# Patient Record
Sex: Female | Born: 1985 | Race: White | Hispanic: No | Marital: Single | Smoking: Former smoker
Health system: Southern US, Community
[De-identification: ages and names within clinical notes are randomized; demographics above are authoritative.]

## PROBLEM LIST (undated history)

## (undated) DIAGNOSIS — R030 Elevated blood-pressure reading, without diagnosis of hypertension: Secondary | ICD-10-CM

## (undated) DIAGNOSIS — F419 Anxiety disorder, unspecified: Secondary | ICD-10-CM

## (undated) DIAGNOSIS — C801 Malignant (primary) neoplasm, unspecified: Secondary | ICD-10-CM

## (undated) DIAGNOSIS — E876 Hypokalemia: Secondary | ICD-10-CM

## (undated) DIAGNOSIS — Z8489 Family history of other specified conditions: Secondary | ICD-10-CM

## (undated) DIAGNOSIS — D649 Anemia, unspecified: Secondary | ICD-10-CM

## (undated) DIAGNOSIS — R519 Headache, unspecified: Secondary | ICD-10-CM

## (undated) DIAGNOSIS — F32A Depression, unspecified: Secondary | ICD-10-CM

## (undated) DIAGNOSIS — K5090 Crohn's disease, unspecified, without complications: Secondary | ICD-10-CM

## (undated) DIAGNOSIS — F329 Major depressive disorder, single episode, unspecified: Secondary | ICD-10-CM

## (undated) DIAGNOSIS — R51 Headache: Secondary | ICD-10-CM

## (undated) DIAGNOSIS — J189 Pneumonia, unspecified organism: Secondary | ICD-10-CM

## (undated) HISTORY — DX: Depression, unspecified: F32.A

## (undated) HISTORY — PX: UMBILICAL HERNIA REPAIR: SHX196

## (undated) HISTORY — DX: Major depressive disorder, single episode, unspecified: F32.9

## (undated) HISTORY — DX: Anxiety disorder, unspecified: F41.9

## (undated) HISTORY — DX: Hypokalemia: E87.6

---
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
## 2007-04-04 ENCOUNTER — Ambulatory Visit (HOSPITAL_BASED_OUTPATIENT_CLINIC_OR_DEPARTMENT_OTHER): Admission: RE | Admit: 2007-04-04 | Discharge: 2007-04-04 | Payer: Self-pay | Admitting: General Surgery

## 2007-05-28 ENCOUNTER — Emergency Department: Payer: Self-pay | Admitting: Emergency Medicine

## 2007-05-29 ENCOUNTER — Other Ambulatory Visit: Payer: Self-pay

## 2007-08-29 ENCOUNTER — Emergency Department: Payer: Self-pay | Admitting: Emergency Medicine

## 2007-10-26 ENCOUNTER — Emergency Department (HOSPITAL_COMMUNITY): Admission: EM | Admit: 2007-10-26 | Discharge: 2007-10-26 | Payer: Self-pay | Admitting: Emergency Medicine

## 2008-01-09 ENCOUNTER — Emergency Department: Payer: Self-pay | Admitting: Emergency Medicine

## 2008-07-17 ENCOUNTER — Emergency Department: Payer: Self-pay | Admitting: Emergency Medicine

## 2008-08-15 ENCOUNTER — Emergency Department: Payer: Self-pay | Admitting: Emergency Medicine

## 2008-09-12 ENCOUNTER — Emergency Department: Payer: Self-pay | Admitting: Unknown Physician Specialty

## 2009-01-14 ENCOUNTER — Emergency Department: Payer: Self-pay

## 2009-06-20 ENCOUNTER — Observation Stay: Payer: Self-pay

## 2009-08-13 ENCOUNTER — Observation Stay: Payer: Self-pay | Admitting: Obstetrics and Gynecology

## 2009-08-29 ENCOUNTER — Inpatient Hospital Stay: Payer: Self-pay

## 2009-08-29 ENCOUNTER — Observation Stay: Payer: Self-pay

## 2010-07-15 NOTE — Op Note (Signed)
[name], [name]              ACCOUNT NO.:  [id number]   MEDICAL RECORD NO.:  [id number]          PATIENT TYPE:  AMB   LOCATION:  [facility]                         FACILITY:  [facility]   PHYSICIAN:  Anselm Pancoast. Weatherly, M.D.DATE OF BIRTH:  1985/08/07   DATE OF PROCEDURE:  04/04/2007  DATE OF DISCHARGE:                               OPERATIVE REPORT   PREOPERATIVE DIAGNOSIS:  Small supraumbilical hernia recent postpartum.   POSTOPERATIVE DIAGNOSIS:  Small supraumbilical hernia recent postpartum.   OPERATION:  Repair of small supraumbilical hernia, no mesh.   ANESTHESIA:  General.   HISTORY:  Mary Maddox is a 25 year old young female who is now about  3 months postpartum during her pregnancy.  She had a little bulge just  above the navel a little left of midline that has been tender.  She has  done well following her delivery, vaginal delivery, and on physical exam  in the office it was not an obvious bulge but on exam, when you press  the area about 3/4 of an inch above the true umbilicus, there was a  little bulge that was tender and she desired that it be surgically  corrected.  She smokes about 1/2 pack of cigarettes a day.  Her husband  is a Arts development officer stationed at [employer] and the patient's family lives  here in [place] and she is going to stay a few days with her Mom and  desired to have this repaired here.  On physical exam preoperatively  there is not an obvious bulge but when you press on the area just above  the umbilicus there is a little bulge that is easily felt that she is  kind of squeamish about.  I could not feel a significant large bulge and  I discussed with her that I would make my judgment on whether mesh was  needed, since the defect was so small I think it is not.  She is in  agreement with this and preop she was given 1 gram of Ancef, positioned  on the OR table and induction of general anesthesia, we used an LOA but  did not paralyze her.  The patient  was prepped with Betadine solution,  draped in a sterile manner and on pressing on the area now you can still  feel that little tender area or a little bulge right above the fascia.  I made a little transverse incision within a skin fold, sharp dissected  down through the subcutaneous tissue and you could see the preperitoneal  fatty tissue coming out through this little fascia defect.  I removed  it, opened the defect just a little larger so I could close the  underlying peritoneum and preperitoneal fascia with a figure-of-eight  suture of #2-0 Surgilon.  With this done __________ Hemostat and  __________,  I think it would be best just to go ahead and close it  transversely with interrupted sutures of #0 Surgilon and not place a  piece of mesh.  I do not really feel a definite fascia defect at the  true umbilicus itself.  This was done, 6 sutures  were placed and the  little defect appears to be closed securely and then I put about 10 mL  of 0.5% Marcaine with adrenaline in for immediate postoperative pain.  I  then closed the subcutaneous tissue with 2 sutures down deep of #2-0  Vicryl and then #3-0 Vicryl subcuticular and then 1/2-inch Steri-Strips  and Benzoin on the skin.  A little occlusive dressing was applied kind  of rolled up in the umbilicus and a 4 x 4 over it held in place with  Hypafix.  The patient will be released after a short stay and will be in  town at least a couple days with her Mom, if her husband and she needs  to go to [facility] towards the end of the week I think it would be  fine, and I will see her in the office in about 3 weeks.  If they desire  that she be rechecked that can be arranged but I do not think that it is  necessary that I see her again until approximately 3 weeks after  surgery.  The patient will be given some Vicodin for initial pain and  some Darvocet and she is not to drive if she is taking the Vicodin for  pain.            ______________________________  Anselm Pancoast. Zachery Dakins, M.D.     WJW/MEDQ  D:  04/04/2007  T:  04/04/2007  Job:  [id number]

## 2010-11-21 LAB — POCT HEMOGLOBIN-HEMACUE: Hemoglobin: 16.4 — ABNORMAL HIGH

## 2011-01-30 ENCOUNTER — Emergency Department: Payer: Self-pay | Admitting: *Deleted

## 2011-02-11 ENCOUNTER — Emergency Department: Payer: Self-pay | Admitting: *Deleted

## 2011-02-18 ENCOUNTER — Other Ambulatory Visit: Payer: Self-pay | Admitting: Internal Medicine

## 2011-02-18 ENCOUNTER — Ambulatory Visit
Admission: RE | Admit: 2011-02-18 | Discharge: 2011-02-18 | Disposition: A | Discharge status: Home / Self Care | Payer: BC Managed Care – PPO | Source: Ambulatory Visit | Attending: Internal Medicine | Admitting: Internal Medicine

## 2011-02-20 ENCOUNTER — Other Ambulatory Visit: Payer: Self-pay | Admitting: Internal Medicine

## 2011-02-20 DIAGNOSIS — R197 Diarrhea, unspecified: Secondary | ICD-10-CM

## 2011-02-26 ENCOUNTER — Ambulatory Visit
Admission: RE | Admit: 2011-02-26 | Discharge: 2011-02-26 | Disposition: A | Discharge status: Home / Self Care | Payer: BC Managed Care – PPO | Source: Ambulatory Visit | Attending: Internal Medicine | Admitting: Internal Medicine

## 2011-02-26 DIAGNOSIS — R1110 Vomiting, unspecified: Secondary | ICD-10-CM

## 2011-02-26 MED ORDER — IOHEXOL 300 MG/ML  SOLN
20.0000 mL | Freq: Once | INTRAMUSCULAR | Status: AC | PRN
  Administered 2011-02-26: 20 mL via ORAL

## 2011-02-26 MED ORDER — IOHEXOL 300 MG/ML  SOLN
100.0000 mL | Freq: Once | INTRAMUSCULAR | Status: AC | PRN
  Administered 2011-02-26: 100 mL via INTRAVENOUS

## 2011-03-02 ENCOUNTER — Encounter: Payer: Self-pay | Admitting: Gastroenterology

## 2011-03-03 DIAGNOSIS — C801 Malignant (primary) neoplasm, unspecified: Secondary | ICD-10-CM

## 2011-03-03 HISTORY — DX: Malignant (primary) neoplasm, unspecified: C80.1

## 2011-03-03 HISTORY — PX: LEEP: SHX91

## 2011-03-18 ENCOUNTER — Ambulatory Visit: Payer: BC Managed Care – PPO | Admitting: Gastroenterology

## 2011-07-26 ENCOUNTER — Emergency Department: Payer: Self-pay | Admitting: *Deleted

## 2011-07-26 LAB — COMPREHENSIVE METABOLIC PANEL WITH GFR
Albumin: 4 g/dL
Alkaline Phosphatase: 69 U/L
Anion Gap: 8
BUN: 12 mg/dL
Bilirubin,Total: 0.4 mg/dL
Calcium, Total: 8.8 mg/dL
Chloride: 107 mmol/L
Co2: 25 mmol/L
Creatinine: 0.76 mg/dL
EGFR (African American): >60
EGFR (Non-African Amer.): >60
Glucose: 88 mg/dL
Osmolality: 279
Potassium: 3.6 mmol/L
SGOT(AST): 19 U/L
SGPT (ALT): 17 U/L
Sodium: 140 mmol/L
Total Protein: 7.1 g/dL

## 2011-07-26 LAB — CBC
HCT: 40.9 % (ref 35.0–47.0)
MCH: 30.8 pg (ref 26.0–34.0)
MCHC: 34.2 g/dL (ref 32.0–36.0)
Platelet: 213 10*3/uL (ref 150–440)
RBC: 4.54 10*6/uL (ref 3.80–5.20)
RDW: 13.2 % (ref 11.5–14.5)
WBC: 7.1 10*3/uL (ref 3.6–11.0)

## 2011-07-30 ENCOUNTER — Ambulatory Visit: Payer: Self-pay | Admitting: Obstetrics and Gynecology

## 2011-11-24 ENCOUNTER — Ambulatory Visit: Payer: Self-pay

## 2011-11-29 ENCOUNTER — Emergency Department: Payer: Self-pay | Admitting: Emergency Medicine

## 2011-11-29 LAB — CBC
MCV: 87 fL (ref 80–100)
Platelet: 177 10*3/uL (ref 150–440)
RBC: 4.77 10*6/uL (ref 3.80–5.20)
RDW: 13.4 % (ref 11.5–14.5)
WBC: 7.1 10*3/uL (ref 3.6–11.0)

## 2011-11-29 LAB — COMPREHENSIVE METABOLIC PANEL
Alkaline Phosphatase: 78 U/L (ref 50–136)
Calcium, Total: 8.8 mg/dL (ref 8.5–10.1)
Co2: 28 mmol/L (ref 21–32)
Creatinine: 0.83 mg/dL (ref 0.60–1.30)
EGFR (Non-African Amer.): >60
Glucose: 84 mg/dL (ref 65–99)
SGPT (ALT): 14 U/L (ref 12–78)

## 2011-11-29 LAB — URINALYSIS, COMPLETE
Bilirubin,UR: NEGATIVE
Glucose,UR: NEGATIVE mg/dL (ref 0–75)
RBC,UR: 6 /HPF (ref 0–5)
Squamous Epithelial: 39

## 2011-11-29 LAB — LIPASE, BLOOD: Lipase: 102 U/L (ref 73–393)

## 2011-12-08 ENCOUNTER — Ambulatory Visit: Payer: Self-pay | Admitting: Gynecologic Oncology

## 2012-03-02 DIAGNOSIS — J189 Pneumonia, unspecified organism: Secondary | ICD-10-CM

## 2012-03-02 HISTORY — DX: Pneumonia, unspecified organism: J18.9

## 2012-04-06 ENCOUNTER — Emergency Department: Payer: Self-pay | Admitting: Emergency Medicine

## 2012-04-06 LAB — CBC
HCT: 45.1 % (ref 35.0–47.0)
HGB: 15.3 g/dL (ref 12.0–16.0)
MCHC: 33.9 g/dL (ref 32.0–36.0)
RDW: 12.7 % (ref 11.5–14.5)
WBC: 5.8 10*3/uL (ref 3.6–11.0)

## 2012-04-06 LAB — URINALYSIS, COMPLETE
Bacteria: NONE SEEN
Bilirubin,UR: NEGATIVE
Nitrite: NEGATIVE
Ph: 9 (ref 4.5–8.0)
RBC,UR: <1 /HPF (ref 0–5)
Specific Gravity: 1.016 (ref 1.003–1.030)

## 2012-12-03 ENCOUNTER — Emergency Department: Payer: Self-pay | Admitting: Emergency Medicine

## 2012-12-03 LAB — COMPREHENSIVE METABOLIC PANEL
Albumin: 4 g/dL (ref 3.4–5.0)
Chloride: 106 mmol/L (ref 98–107)
Co2: 24 mmol/L (ref 21–32)
EGFR (African American): >60
Osmolality: 279 (ref 275–301)
Potassium: 3.6 mmol/L (ref 3.5–5.1)
SGOT(AST): 24 U/L (ref 15–37)
SGPT (ALT): 16 U/L (ref 12–78)

## 2012-12-03 LAB — CBC
HGB: 14.9 g/dL (ref 12.0–16.0)
MCV: 88 fL (ref 80–100)
Platelet: 186 10*3/uL (ref 150–440)
RBC: 4.81 10*6/uL (ref 3.80–5.20)
RDW: 13 % (ref 11.5–14.5)
WBC: 6.6 10*3/uL (ref 3.6–11.0)

## 2013-06-04 IMAGING — US US ABDOMEN COMPLETE
1 series · 14 of 25 positions shown · non-contrast
Comparison: None.

CLINICAL DATA: Abdominal pain and nausea.

COMPLETE ABDOMINAL ULTRASOUND

[Series 1: us abdomen complete · 0.26mm/px · 14 of 84 slices shown]
[im 1/84]
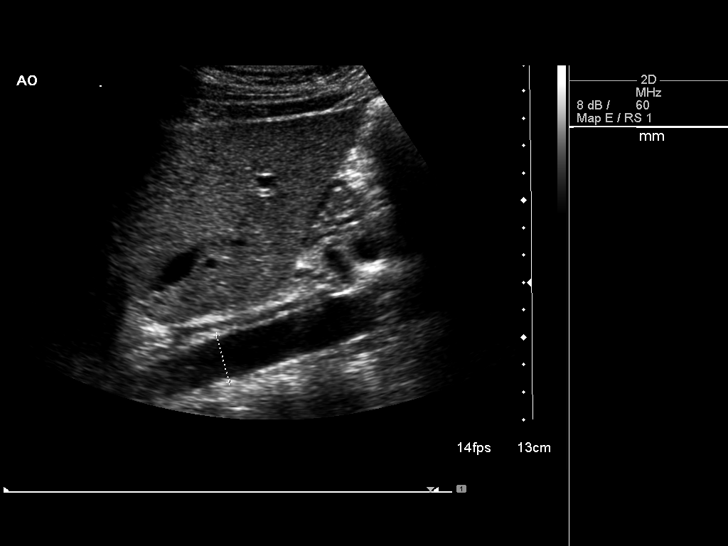
[im 7/84]
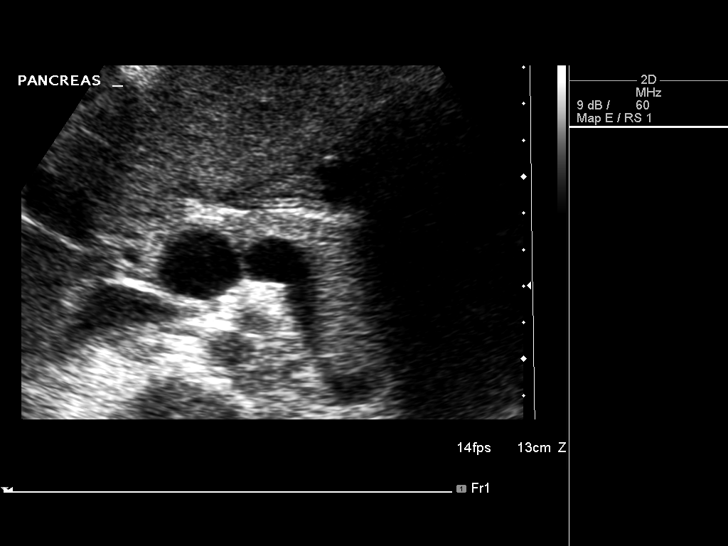
[im 14/84]
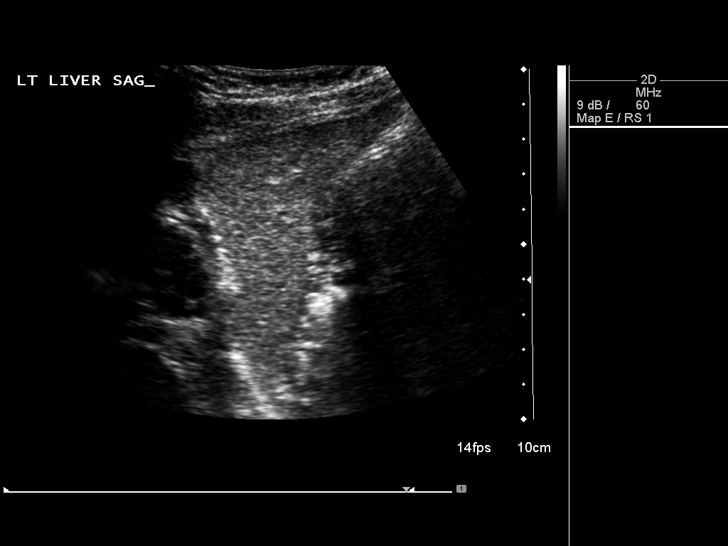
[im 21/84]
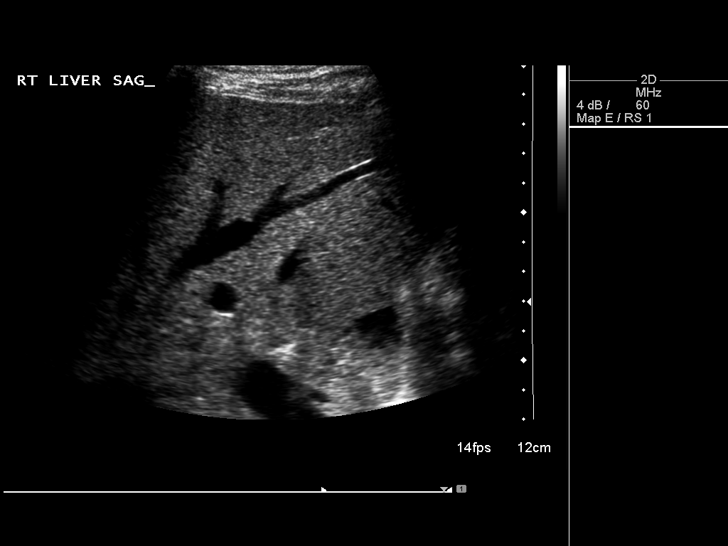
[im 28/84]
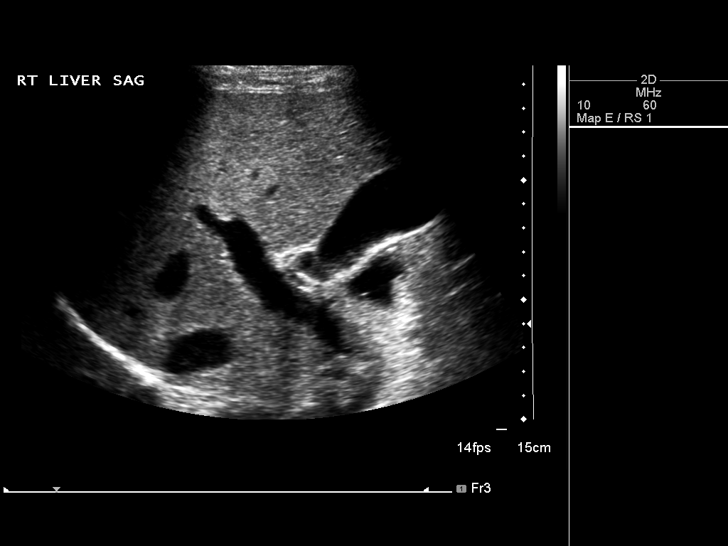
[im 32/84]
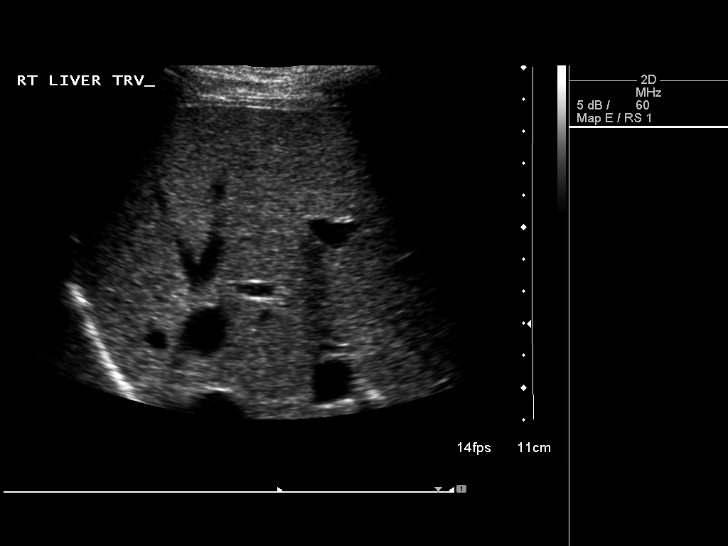
[im 39/84]
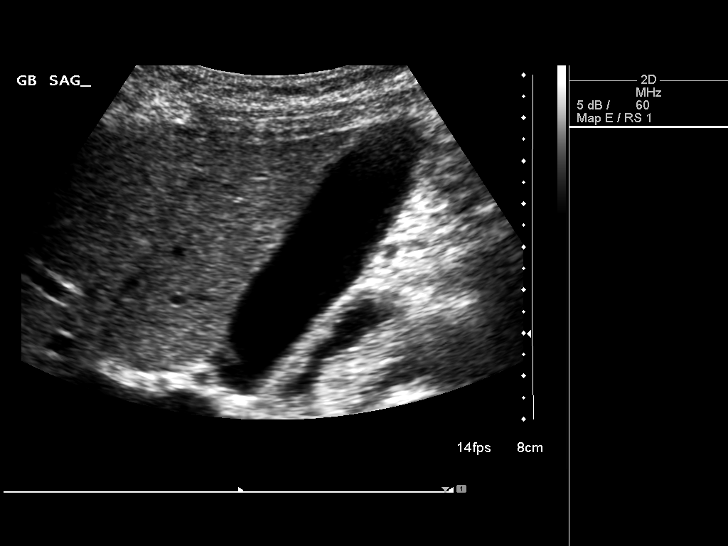
[im 45/84]
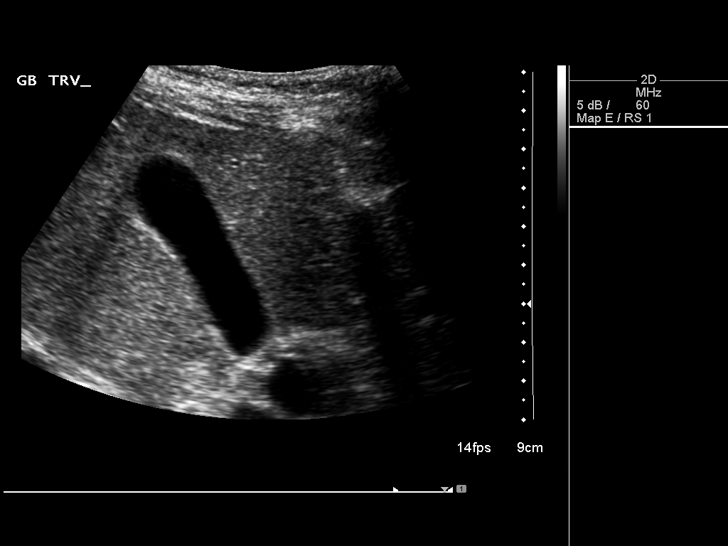
[im 52/84]
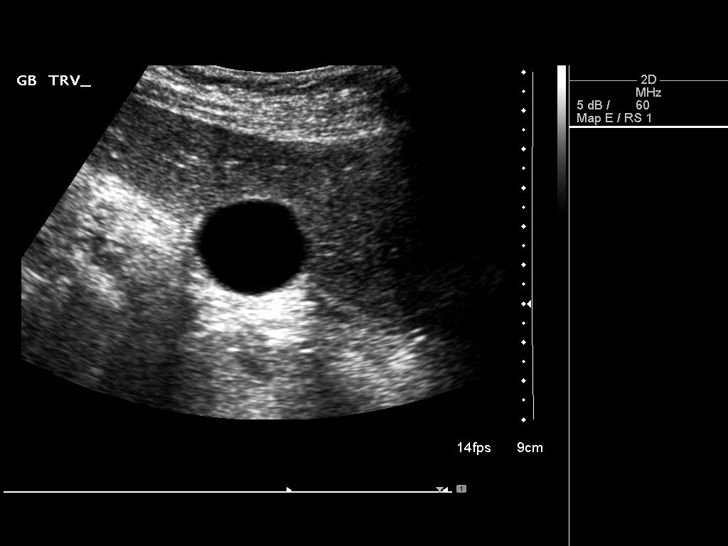
[im 56/84]
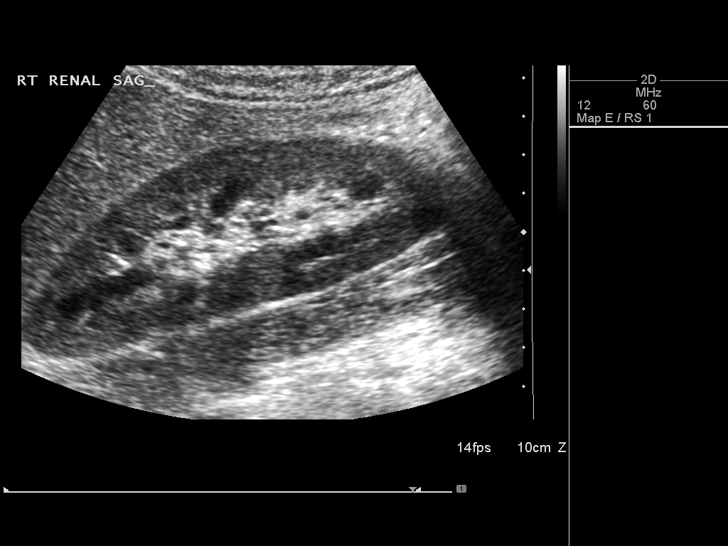
[im 63/84]
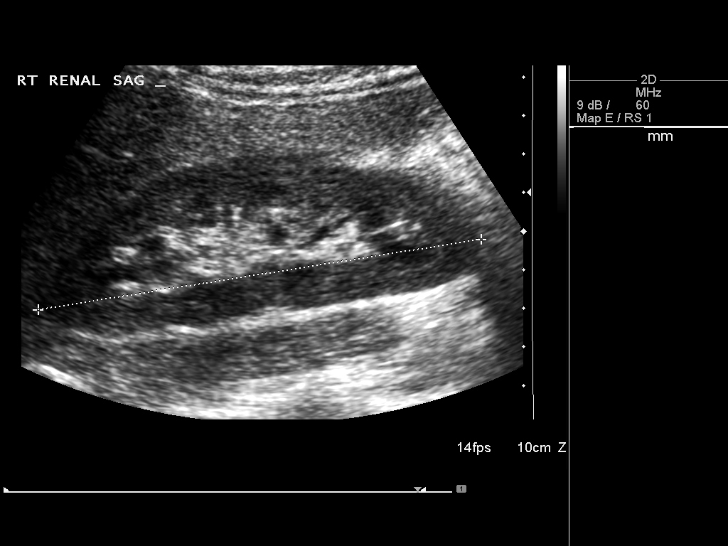
[im 70/84]
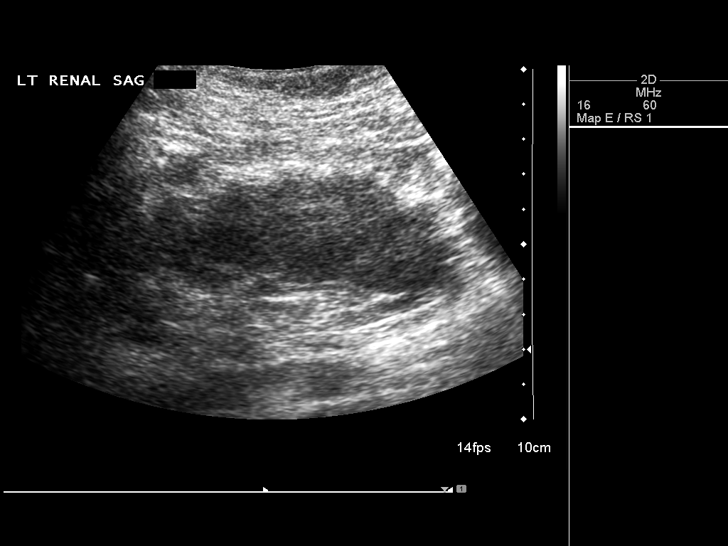
[im 77/84]
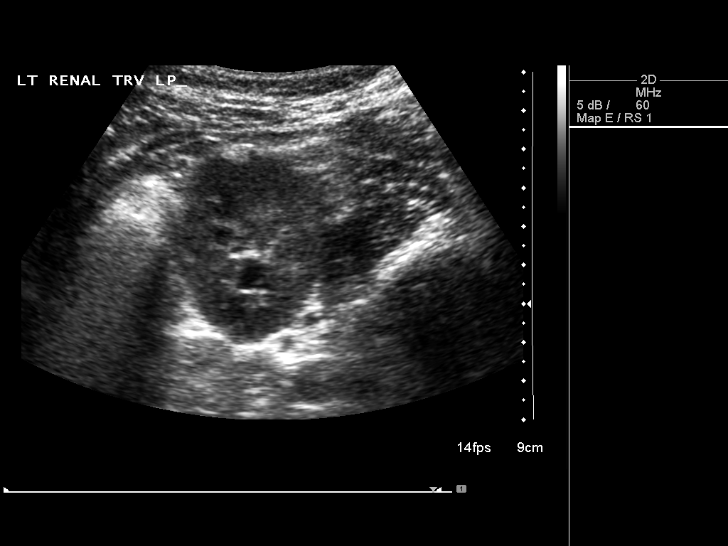
[im 84/84]
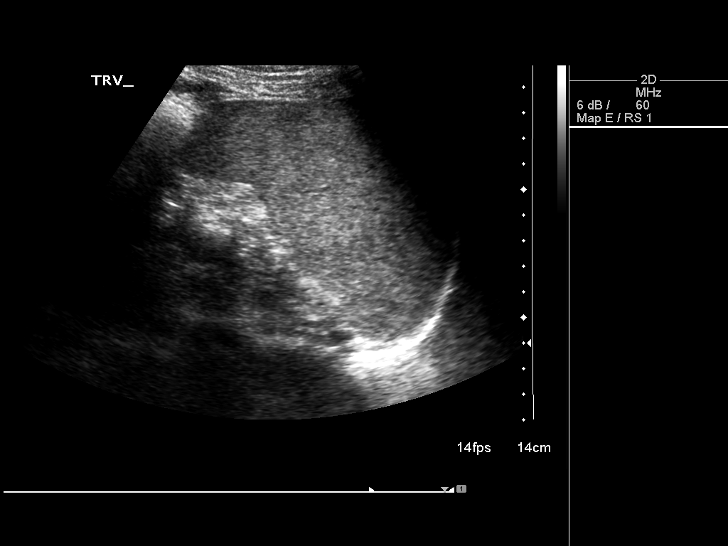

[14 of 25 positions shown; findings below may reference images not displayed]

FINDINGS: Gallbladder:  No gallstones, gallbladder wall thickening, or
pericholecystic fluid.

Common bile duct:  Measures 0.3 cm.

Liver:  No focal lesion identified.  Within normal limits in
parenchymal echogenicity.

IVC:  Appears normal.

Pancreas:  No focal abnormality seen.

Spleen:  Measures 9.7 cm and appears normal.

Right Kidney:  Measures 11.6 cm and appears normal.

Left Kidney:  Measures 11.2 cm and appears normal.

Abdominal aorta:  No aneurysm identified.
IMPRESSION: Negative abdominal ultrasound.

## 2013-10-05 ENCOUNTER — Observation Stay: Payer: Self-pay | Admitting: Obstetrics and Gynecology

## 2015-01-04 ENCOUNTER — Emergency Department
Admission: EM | Admit: 2015-01-04 | Discharge: 2015-01-04 | Discharge status: Against Medical Advice | Payer: Medicaid Other | Attending: Emergency Medicine | Admitting: Emergency Medicine

## 2015-01-04 ENCOUNTER — Encounter: Payer: Self-pay | Admitting: *Deleted

## 2015-01-04 DIAGNOSIS — Y998 Other external cause status: Secondary | ICD-10-CM | POA: Diagnosis not present

## 2015-01-04 DIAGNOSIS — O99331 Smoking (tobacco) complicating pregnancy, first trimester: Secondary | ICD-10-CM | POA: Diagnosis not present

## 2015-01-04 DIAGNOSIS — O9989 Other specified diseases and conditions complicating pregnancy, childbirth and the puerperium: Secondary | ICD-10-CM | POA: Diagnosis present

## 2015-01-04 DIAGNOSIS — Z3A08 8 weeks gestation of pregnancy: Secondary | ICD-10-CM | POA: Insufficient documentation

## 2015-01-04 DIAGNOSIS — R109 Unspecified abdominal pain: Secondary | ICD-10-CM | POA: Insufficient documentation

## 2015-01-04 DIAGNOSIS — O9A211 Injury, poisoning and certain other consequences of external causes complicating pregnancy, first trimester: Secondary | ICD-10-CM | POA: Diagnosis not present

## 2015-01-04 DIAGNOSIS — Y92410 Unspecified street and highway as the place of occurrence of the external cause: Secondary | ICD-10-CM | POA: Insufficient documentation

## 2015-01-04 DIAGNOSIS — F17210 Nicotine dependence, cigarettes, uncomplicated: Secondary | ICD-10-CM | POA: Diagnosis not present

## 2015-01-04 DIAGNOSIS — Y9389 Activity, other specified: Secondary | ICD-10-CM | POA: Diagnosis not present

## 2015-01-04 NOTE — ED Notes (Signed)
Pt called to room without answer.  
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
## 2015-01-07 ENCOUNTER — Telehealth: Payer: Self-pay | Admitting: Emergency Medicine

## 2015-01-07 NOTE — ED Notes (Signed)
Called patient due to lwot to inquire about condition and follow up plans. Left message with my number. 

## 2015-01-16 LAB — OB RESULTS CONSOLE GC/CHLAMYDIA
Chlamydia: NEGATIVE
Gonorrhea: NEGATIVE

## 2015-01-28 LAB — OB RESULTS CONSOLE VARICELLA ZOSTER ANTIBODY, IGG: Varicella: IMMUNE

## 2015-01-28 LAB — OB RESULTS CONSOLE HIV ANTIBODY (ROUTINE TESTING): HIV: NONREACTIVE

## 2015-01-28 LAB — OB RESULTS CONSOLE RPR: RPR: NONREACTIVE

## 2015-01-28 LAB — OB RESULTS CONSOLE HEPATITIS B SURFACE ANTIGEN: HEP B S AG: NEGATIVE

## 2015-01-28 LAB — OB RESULTS CONSOLE RUBELLA ANTIBODY, IGM: RUBELLA: IMMUNE

## 2015-03-03 NOTE — L&D Delivery Note (Signed)
Delivery Note At 7:49 PM a viable female was delivered via Vaginal, Spontaneous Delivery (Presentation: Right Occiput Anterior).  APGAR: 8, 9; weight 6 lb 14.8 oz (3140 g).   Placenta status: Intact, Spontaneous.  Cord: 3 vessels with the following complications: nuchal cord x 1 reduced on perineum  Anesthesia: Epidural  Episiotomy: None Lacerations: None Suture Repair: NA Est. Blood Loss (mL): 400  Mom to postpartum.  Baby to Couplet care / Skin to Skin.  Mary Maddox 09/11/2015, 3:50 PM

## 2015-08-14 LAB — OB RESULTS CONSOLE GBS: STREP GROUP B AG: NEGATIVE

## 2015-08-30 ENCOUNTER — Inpatient Hospital Stay
Admission: RE | Admit: 2015-08-30 | Discharge: 2015-09-02 | DRG: 767 | Disposition: A | Discharge status: Home / Self Care | Payer: Medicaid Other | Attending: Obstetrics & Gynecology | Admitting: Obstetrics & Gynecology

## 2015-08-30 DIAGNOSIS — O99334 Smoking (tobacco) complicating childbirth: Secondary | ICD-10-CM | POA: Diagnosis present

## 2015-08-30 DIAGNOSIS — D62 Acute posthemorrhagic anemia: Secondary | ICD-10-CM | POA: Diagnosis not present

## 2015-08-30 DIAGNOSIS — Z302 Encounter for sterilization: Secondary | ICD-10-CM | POA: Diagnosis not present

## 2015-08-30 DIAGNOSIS — Z3A39 39 weeks gestation of pregnancy: Secondary | ICD-10-CM

## 2015-08-30 DIAGNOSIS — O9081 Anemia of the puerperium: Secondary | ICD-10-CM | POA: Diagnosis not present

## 2015-08-30 DIAGNOSIS — Z3483 Encounter for supervision of other normal pregnancy, third trimester: Secondary | ICD-10-CM | POA: Diagnosis present

## 2015-08-30 HISTORY — DX: Crohn's disease, unspecified, without complications: K50.90

## 2015-08-30 LAB — CBC
HCT: 33.3 % — ABNORMAL LOW (ref 35.0–47.0)
Hemoglobin: 11.8 g/dL — ABNORMAL LOW (ref 12.0–16.0)
MCH: 28.9 pg (ref 26.0–34.0)
MCHC: 35.4 g/dL (ref 32.0–36.0)
MCV: 81.7 fL (ref 80.0–100.0)
PLATELETS: 160 10*3/uL (ref 150–440)
RBC: 4.08 MIL/uL (ref 3.80–5.20)
RDW: 13.4 % (ref 11.5–14.5)
WBC: 7.5 10*3/uL (ref 3.6–11.0)

## 2015-08-30 LAB — TYPE AND SCREEN
ABO/RH(D): O POS
ANTIBODY SCREEN: NEGATIVE

## 2015-08-30 MED ORDER — LIDOCAINE HCL (PF) 1 % IJ SOLN: 30.0000 mL | INTRAMUSCULAR | Status: DC | PRN

## 2015-08-30 MED ORDER — OXYTOCIN 40 UNITS IN LACTATED RINGERS INFUSION - SIMPLE MED
2.5000 [IU]/h | INTRAVENOUS | Status: DC
  Filled 2015-08-30: qty 1000

## 2015-08-30 MED ORDER — ZOLPIDEM TARTRATE 5 MG PO TABS
5.0000 mg | ORAL_TABLET | Freq: Every evening | ORAL | Status: DC | PRN
  Administered 2015-08-30: 5 mg via ORAL
  Filled 2015-08-30: qty 1

## 2015-08-30 MED ORDER — ONDANSETRON HCL 4 MG/2ML IJ SOLN: 4.0000 mg | Freq: Four times a day (QID) | INTRAMUSCULAR | Status: DC | PRN

## 2015-08-30 MED ORDER — LACTATED RINGERS IV SOLN
INTRAVENOUS | Status: DC
  Administered 2015-08-30 – 2015-08-31 (×3): via INTRAVENOUS

## 2015-08-30 MED ORDER — TERBUTALINE SULFATE 1 MG/ML IJ SOLN: 0.2500 mg | Freq: Once | INTRAMUSCULAR | Status: DC | PRN

## 2015-08-30 MED ORDER — SOD CITRATE-CITRIC ACID 500-334 MG/5ML PO SOLN: 30.0000 mL | ORAL | Status: DC | PRN

## 2015-08-30 MED ORDER — ACETAMINOPHEN 325 MG PO TABS: 650.0000 mg | ORAL_TABLET | ORAL | Status: DC | PRN

## 2015-08-30 MED ORDER — DINOPROSTONE 10 MG VA INST
10.0000 mg | VAGINAL_INSERT | Freq: Once | VAGINAL | Status: AC
  Administered 2015-08-30: 10 mg via VAGINAL
  Filled 2015-08-30: qty 1

## 2015-08-30 MED ORDER — LACTATED RINGERS IV SOLN
500.0000 mL | INTRAVENOUS | Status: DC | PRN
  Administered 2015-08-31: 500 mL via INTRAVENOUS

## 2015-08-30 MED ORDER — OXYTOCIN BOLUS FROM INFUSION
500.0000 mL | INTRAVENOUS | Status: DC
  Administered 2015-08-31: 500 mL via INTRAVENOUS

## 2015-08-30 NOTE — H&P (Signed)
History and Physical Interval Note:  08/30/2015 8:44 PM  Mary Maddox  has presented today for INDUCTION OF LABOR (cervical ripening agents), 39 weeks,  with the diagnosis of ACD and HOFL (fast labor). The various methods of treatment have been discussed with the patient and family. After consideration of risks, benefits and other options for treatment, the patient has consented to  Labor induction .  The patient's history has been reviewed, patient examined, no change in status, and is stable for induction as planned.  See H&P. I have reviewed the patient's chart and labs.  Questions were answered to the patient's satisfaction.    Hoyt Koch

## 2015-08-31 ENCOUNTER — Inpatient Hospital Stay: Payer: Medicaid Other | Admitting: Anesthesiology

## 2015-08-31 ENCOUNTER — Encounter: Payer: Self-pay | Admitting: *Deleted

## 2015-08-31 LAB — PLATELET COUNT: PLATELETS: 148 10*3/uL — AB (ref 150–440)

## 2015-08-31 MED ORDER — LIDOCAINE-EPINEPHRINE (PF) 1.5 %-1:200000 IJ SOLN
INTRAMUSCULAR | Status: DC | PRN
  Administered 2015-08-31: 3 mL via EPIDURAL

## 2015-08-31 MED ORDER — DIBUCAINE 1 % RE OINT: 1.0000 "application " | TOPICAL_OINTMENT | RECTAL | Status: DC | PRN

## 2015-08-31 MED ORDER — EPHEDRINE 5 MG/ML INJ
10.0000 mg | INTRAVENOUS | Status: DC | PRN
  Filled 2015-08-31: qty 2

## 2015-08-31 MED ORDER — BENZOCAINE-MENTHOL 20-0.5 % EX AERO: 1.0000 "application " | INHALATION_SPRAY | CUTANEOUS | Status: DC | PRN

## 2015-08-31 MED ORDER — WITCH HAZEL-GLYCERIN EX PADS: 1.0000 "application " | MEDICATED_PAD | CUTANEOUS | Status: DC | PRN

## 2015-08-31 MED ORDER — OXYTOCIN 40 UNITS IN LACTATED RINGERS INFUSION - SIMPLE MED
1.0000 m[IU]/min | INTRAVENOUS | Status: DC
  Administered 2015-08-31: 1 m[IU]/min via INTRAVENOUS

## 2015-08-31 MED ORDER — LIDOCAINE HCL (PF) 1 % IJ SOLN
INTRAMUSCULAR | Status: AC
  Filled 2015-08-31: qty 30

## 2015-08-31 MED ORDER — AMMONIA AROMATIC IN INHA
RESPIRATORY_TRACT | Status: AC
  Filled 2015-08-31: qty 10

## 2015-08-31 MED ORDER — PHENYLEPHRINE 40 MCG/ML (10ML) SYRINGE FOR IV PUSH (FOR BLOOD PRESSURE SUPPORT)
80.0000 ug | PREFILLED_SYRINGE | INTRAVENOUS | Status: DC | PRN
  Filled 2015-08-31: qty 5

## 2015-08-31 MED ORDER — DIPHENHYDRAMINE HCL 50 MG/ML IJ SOLN: 12.5000 mg | INTRAMUSCULAR | Status: DC | PRN

## 2015-08-31 MED ORDER — OXYCODONE-ACETAMINOPHEN 5-325 MG PO TABS: 2.0000 | ORAL_TABLET | ORAL | Status: DC | PRN

## 2015-08-31 MED ORDER — COCONUT OIL OIL: 1.0000 "application " | TOPICAL_OIL | Status: DC | PRN

## 2015-08-31 MED ORDER — OXYCODONE-ACETAMINOPHEN 5-325 MG PO TABS
1.0000 | ORAL_TABLET | ORAL | Status: DC | PRN
  Administered 2015-09-01 (×2): 1 via ORAL
  Filled 2015-08-31 (×2): qty 1

## 2015-08-31 MED ORDER — ONDANSETRON HCL 4 MG/2ML IJ SOLN: 4.0000 mg | INTRAMUSCULAR | Status: DC | PRN

## 2015-08-31 MED ORDER — IBUPROFEN 600 MG PO TABS
ORAL_TABLET | ORAL | Status: AC
  Administered 2015-08-31: 600 mg via ORAL
  Filled 2015-08-31: qty 1

## 2015-08-31 MED ORDER — LACTATED RINGERS IV SOLN: 500.0000 mL | Freq: Once | INTRAVENOUS | Status: DC

## 2015-08-31 MED ORDER — FERROUS SULFATE 325 (65 FE) MG PO TABS
325.0000 mg | ORAL_TABLET | Freq: Every day | ORAL | Status: DC
Start: 2015-09-01 — End: 2015-09-02
  Administered 2015-09-02: 325 mg via ORAL
  Filled 2015-08-31: qty 1

## 2015-08-31 MED ORDER — SIMETHICONE 80 MG PO CHEW
80.0000 mg | CHEWABLE_TABLET | ORAL | Status: DC | PRN
  Administered 2015-09-01: 80 mg via ORAL
  Filled 2015-08-31: qty 1

## 2015-08-31 MED ORDER — PRENATAL MULTIVITAMIN CH
1.0000 | ORAL_TABLET | Freq: Every day | ORAL | Status: DC
  Administered 2015-09-02: 1 via ORAL
  Filled 2015-08-31: qty 1

## 2015-08-31 MED ORDER — FENTANYL 2.5 MCG/ML W/ROPIVACAINE 0.2% IN NS 100 ML EPIDURAL INFUSION (ARMC-ANES)
EPIDURAL | Status: AC
  Administered 2015-08-31: 10 mL/h via EPIDURAL
  Filled 2015-08-31: qty 100

## 2015-08-31 MED ORDER — DOCUSATE SODIUM 100 MG PO CAPS
100.0000 mg | ORAL_CAPSULE | Freq: Every day | ORAL | Status: DC
  Administered 2015-09-02: 100 mg via ORAL
  Filled 2015-08-31: qty 1

## 2015-08-31 MED ORDER — ONDANSETRON HCL 4 MG PO TABS: 4.0000 mg | ORAL_TABLET | ORAL | Status: DC | PRN

## 2015-08-31 MED ORDER — FENTANYL 2.5 MCG/ML W/ROPIVACAINE 0.2% IN NS 100 ML EPIDURAL INFUSION (ARMC-ANES)
10.0000 mL/h | EPIDURAL | Status: DC
  Administered 2015-08-31: 10 mL/h via EPIDURAL

## 2015-08-31 MED ORDER — OXYTOCIN 10 UNIT/ML IJ SOLN
INTRAMUSCULAR | Status: AC
  Filled 2015-08-31: qty 2

## 2015-08-31 MED ORDER — LIDOCAINE HCL (PF) 1 % IJ SOLN
INTRAMUSCULAR | Status: DC | PRN
  Administered 2015-08-31: 1 mL

## 2015-08-31 MED ORDER — IBUPROFEN 600 MG PO TABS
600.0000 mg | ORAL_TABLET | Freq: Four times a day (QID) | ORAL | Status: DC | PRN
Start: 2015-08-31 — End: 2015-09-02
  Administered 2015-08-31 – 2015-09-02 (×3): 600 mg via ORAL
  Filled 2015-08-31 (×3): qty 1

## 2015-08-31 MED ORDER — TERBUTALINE SULFATE 1 MG/ML IJ SOLN: 0.2500 mg | Freq: Once | INTRAMUSCULAR | Status: DC | PRN

## 2015-08-31 MED ORDER — SODIUM CHLORIDE FLUSH 0.9 % IV SOLN
INTRAVENOUS | Status: AC
  Administered 2015-08-31: 10 mL
  Administered 2015-08-31: 11:00:00
  Filled 2015-08-31: qty 20

## 2015-08-31 MED ORDER — MISOPROSTOL 200 MCG PO TABS
ORAL_TABLET | ORAL | Status: AC
  Filled 2015-08-31: qty 4

## 2015-08-31 MED ORDER — MISOPROSTOL 25 MCG QUARTER TABLET
25.0000 ug | ORAL_TABLET | ORAL | Status: AC
Start: 2015-08-31 — End: 2015-08-31
  Administered 2015-08-31: 25 ug via ORAL
  Filled 2015-08-31: qty 1

## 2015-08-31 NOTE — Anesthesia Preprocedure Evaluation (Signed)
Anesthesia Evaluation  Patient identified by MRN, date of birth, ID band Patient awake    Reviewed: Allergy & Precautions, NPO status , Patient's Chart, lab work & pertinent test results  Airway Mallampati: II       Dental   Pulmonary Current Smoker,           Cardiovascular negative cardio ROS       Neuro/Psych negative neurological ROS  negative psych ROS   GI/Hepatic negative GI ROS, Neg liver ROS,   Endo/Other  negative endocrine ROS  Renal/GU negative Renal ROS  negative genitourinary   Musculoskeletal negative musculoskeletal ROS (+)   Abdominal   Peds negative pediatric ROS (+)  Hematology negative hematology ROS (+)   Anesthesia Other Findings   Reproductive/Obstetrics (+) Pregnancy                             Anesthesia Physical Anesthesia Plan  ASA: II  Anesthesia Plan: Epidural   Post-op Pain Management:    Induction:   Airway Management Planned:   Additional Equipment:   Intra-op Plan:   Post-operative Plan:   Informed Consent: I have reviewed the patients History and Physical, chart, labs and discussed the procedure including the risks, benefits and alternatives for the proposed anesthesia with the patient or authorized representative who has indicated his/her understanding and acceptance.     Plan Discussed with:   Anesthesia Plan Comments:         Anesthesia Quick Evaluation

## 2015-08-31 NOTE — Discharge Instructions (Signed)
Vaginal Delivery, Care After Refer to this sheet in the next few weeks. These discharge instructions provide you with information on caring for yourself after delivery. Your caregiver may also give you specific instructions. Your treatment has been planned according to the most current medical practices available, but problems sometimes occur. Call your caregiver if you have any problems or questions after you go home. HOME CARE INSTRUCTIONS 1. Take over-the-counter or prescription medicines only as directed by your caregiver or pharmacist. 2. Do not drink alcohol, especially if you are breastfeeding or taking medicine to relieve pain. 3. Do not smoke tobacco. 4. Continue to use good perineal care. Good perineal care includes: 1. Wiping your perineum from back to front 2. Keeping your perineum clean. 3. You can do sitz baths twice a day, to help keep this area clean 5. Do not use tampons, douche or have sex until your caregiver says it is okay. 6. Shower only and avoid sitting in submerged water, aside from sitz baths 7. Wear a well-fitting bra that provides breast support. 8. Eat healthy foods. 9. Drink enough fluids to keep your urine clear or pale yellow. 10. Eat high-fiber foods such as whole grain cereals and breads, brown rice, beans, and fresh fruits and vegetables every day. These foods may help prevent or relieve constipation. 11. Avoid constipation with high fiber foods or medications, such as miralax or metamucil 12. Follow your caregiver's recommendations regarding resumption of activities such as climbing stairs, driving, lifting, exercising, or traveling. 13. Talk to your caregiver about resuming sexual activities. Resumption of sexual activities is dependent upon your risk of infection, your rate of healing, and your comfort and desire to resume sexual activity. 14. Try to have someone help you with your household activities and your newborn for at least a few days after you leave  the hospital. 15. Rest as much as possible. Try to rest or take a nap when your newborn is sleeping. 16. Increase your activities gradually. 17. Keep all of your scheduled postpartum appointments. It is very important to keep your scheduled follow-up appointments. At these appointments, your caregiver will be checking to make sure that you are healing physically and emotionally. SEEK MEDICAL CARE IF:   You are passing large clots from your vagina. Save any clots to show your caregiver.  You have a foul smelling discharge from your vagina.  You have trouble urinating.  You are urinating frequently.  You have pain when you urinate.  You have a change in your bowel movements.  You have increasing redness, pain, or swelling near your vaginal incision (episiotomy) or vaginal tear.  You have pus draining from your episiotomy or vaginal tear.  Your episiotomy or vaginal tear is separating.  You have painful, hard, or reddened breasts.  You have a severe headache.  You have blurred vision or see spots.  You feel sad or depressed.  You have thoughts of hurting yourself or your newborn.  You have questions about your care, the care of your newborn, or medicines.  You are dizzy or light-headed.  You have a rash.  You have nausea or vomiting.  You were breastfeeding and have not had a menstrual period within 12 weeks after you stopped breastfeeding.  You are not breastfeeding and have not had a menstrual period by the 12th week after delivery.  You have a fever. SEEK IMMEDIATE MEDICAL CARE IF:   You have persistent pain.  You have chest pain.  You have shortness of breath.    You faint.  You have leg pain.  You have stomach pain.  Your vaginal bleeding saturates two or more sanitary pads in 1 hour. MAKE SURE YOU:   Understand these instructions.  Will watch your condition.  Will get help right away if you are not doing well or get worse. Document Released:  02/14/2000 Document Revised: 07/03/2013 Document Reviewed: 10/14/2011 ExitCare Patient Information 2015 [employer]. This information is not intended to replace advice given to you by your health care provider. Make sure you discuss any questions you have with your health care provider.  Sitz Bath A sitz bath is a warm water bath taken in the sitting position. The water covers only the hips and butt (buttocks). We recommend using one that fits in the toilet, to help with ease of use and cleanliness. It may be used for either healing or cleaning purposes. Sitz baths are also used to relieve pain, itching, or muscle tightening (spasms). The water may contain medicine. Moist heat will help you heal and relax.  HOME CARE  Take 3 to 4 sitz baths a day. 18. Fill the bathtub half-full with warm water. 19. Sit in the water and open the drain a little. 20. Turn on the warm water to keep the tub half-full. Keep the water running constantly. 21. Soak in the water for 15 to 20 minutes. 22. After the sitz bath, pat the affected area dry. GET HELP RIGHT AWAY IF: You get worse instead of better. Stop the sitz baths if you get worse. MAKE SURE YOU:  Understand these instructions.  Will watch your condition.  Will get help right away if you are not doing well or get worse. Document Released: 03/26/2004 Document Revised: 11/11/2011 Document Reviewed: 06/16/2010 ExitCare Patient Information 2015 [employer]. This information is not intended to replace advice given to you by your health care provider. Make sure you discuss any questions you have with your health care provider.    

## 2015-08-31 NOTE — Progress Notes (Signed)
L&D Progress Note   30 yo G7 P3033 at 39 weeks admitted for elective IOL. Cervidil inserted last night around 2200.   S: Not really feeling many contractions. Had an episode of nausea and vomiting after eating some green jello  O: BP 104/57 mmHg  Pulse 59  Temp(Src) 98.7 F (37.1 C) (Oral)  Resp 16  Ht 5\' 6"  (1.676 m)  Wt 73.936 kg (163 lb)  BMI 26.32 kg/m2  Breastfeeding? Unknown  General: in NAD FHR: 135-140 with accelerations to 150s, moderate variability Toco: mild irregular contractions Cervix: Cervidil removed 3/60% (multip thick)/ -1 to -2/ vtx EFW: 7#  A: IUP at 39 weeks for elective IOL with Bishop score 8 Cat 1 tracing GBS negative  P: CAn eat and shower Start Cytotec after above orally.   Dalia Heading, CNM

## 2015-08-31 NOTE — Progress Notes (Signed)
L&D Progress NOte  S: Feeling more contractions and more pressure  O: .General: breathing with some contractions FHR: 130s with accelerations to 150s to 160s, moderate variability Toco: q2 min contractions. Had first dose Cytotec 4 hours ago Cervix: 3.5/60%/-1 to -2/ mid position  A: Slowly progressing Cat 1 tracing  P: Monitor over the next hour and if the contractions are not increasing in intensity or space out...will start Pitocin Can be up and ambulate  Jameison Haji, CNM

## 2015-08-31 NOTE — Anesthesia Procedure Notes (Signed)
Epidural Patient location during procedure: OB Start time: 08/31/2015 6:22 PM End time: 08/31/2015 6:37 PM  Staffing Performed by: anesthesiologist   Preanesthetic Checklist Completed: patient identified, site marked, surgical consent, pre-op evaluation, timeout performed, IV checked, risks and benefits discussed and monitors and equipment checked  Epidural Patient position: sitting Prep: Betadine Patient monitoring: heart rate, continuous pulse ox and blood pressure Approach: midline Location: L4-L5 Injection technique: LOR saline  Needle:  Needle type: Tuohy  Needle gauge: 17 G Needle length: 9 cm and 9 Needle insertion depth: 7 cm Catheter type: closed end flexible Catheter size: 20 Guage Catheter at skin depth: 10 cm Test dose: negative and 1.5% lidocaine with Epi 1:200 K  Assessment Events: blood not aspirated, injection not painful, no injection resistance, negative IV test and no paresthesia  Additional Notes   Patient tolerated the insertion well without complications.Reason for block:procedure for pain

## 2015-08-31 NOTE — Discharge Summary (Signed)
Physician Obstetric Discharge Summary  Patient ID: Mary Maddox MRN: [id number] DOB/AGE: [birth year] 28 y.o.   Date of Admission: 08/30/2015  Date of Discharge:   Admitting Diagnosis: Elective induction of labor at [redacted]w[redacted]d   Secondary Diagnosis: Desires sterilization  Mode of Delivery: normal spontaneous vaginal delivery 08/31/2015      Discharge Diagnosis: Term intrauterine pregnancy-delivered   Intrapartum Procedures: Atificial rupture of membranes, epidural and pitocin augmentation   Post partum procedures: PP BTL  Complications: none   Brief Hospital Course  Mary Maddox is a [id number] who had a SVD on 08/31/2015;  for further details of this delivery, please refer to the delivery note.  Patient had an uncomplicated postpartum course.  By time of discharge on PPD#2, her pain was controlled on oral pain medications; she had appropriate lochia and was ambulating, voiding without difficulty and tolerating regular diet. Pt did c/o pain below incision for BTL. She was deemed stable for discharge to home.      Labs: CBC Latest Ref Rng 08/31/2015 08/30/2015 12/03/2012  WBC 3.6 - 11.0 K/uL - 7.5 6.6  Hemoglobin 12.0 - 16.0 g/dL - 11.8(L) 14.9  Hematocrit 35.0 - 47.0 % - 33.3(L) 42.4  Platelets 150 - 440 K/uL 148(L) 160 186   PP HCT: 29.3  O POS/ RI/ VI/ GBS negative  Physical exam:  Filed Vitals:   09/01/15 2004 09/02/15 0004 09/02/15 0435 09/02/15 0723  BP: 117/69   110/62  Pulse: 60   54  Temp: 98.8 F (37.1 C) 98.3 F (36.8 C) 98.2 F (36.8 C) 98.1 F (36.7 C)  TempSrc: Oral Oral Oral Oral  Resp: 18   20  Height:      Weight:      SpO2: 98%      General: alert and no distress Lochia: appropriate Abdomen: soft, NT Uterine Fundus: firm Incision: healing well, no significant drainage, no dehiscence, no significant erythema Extremities: No evidence of DVT seen on physical exam. No lower extremity edema.  Discharge Instructions: Per After Visit Summary. Activity:  Advance as tolerated. Pelvic rest for 6 weeks.  Also refer to Discharge Instructions Diet: Regular Medications:   Medication List    STOP taking these medications        zolpidem 5 MG tablet  Commonly known as:  AMBIEN      TAKE these medications        ibuprofen 600 MG tablet  Commonly known as:  ADVIL,MOTRIN  Take 1 tablet (600 mg total) by mouth every 6 (six) hours as needed for mild pain, moderate pain or cramping.     multivitamin-prenatal 27-0.8 MG Tabs tablet  Take 1 tablet by mouth daily at 12 noon.     oxyCODONE-acetaminophen 5-325 MG tablet  Commonly known as:  PERCOCET/ROXICET  Take 1 tablet by mouth every 4 (four) hours as needed for moderate pain (pain score 4-7/10).      ; Outpatient follow up:      Follow-up Information    Schedule an appointment as soon as possible for a visit with GUTIERREZ, COLLEEN, CNM.   Specialty:  Certified Nurse Midwife   Why:  6 week postpartum check   Contact information:   [place] [place] [address] [phone]      Postpartum contraception: bilateral tubal ligation  Discharged Condition: good  Discharged to: home   Newborn Data: Disposition:home with mother  Apgars: APGAR (1 MIN): 8   APGAR (5 MINS): 9   APGAR (10 MINS):    Baby Feeding:  Bottle and Breast  Dalia Heading, CNM 08/31/2015 8:27 PM   Updated 09/02/15 Burlene Arnt, CNM

## 2015-09-01 ENCOUNTER — Encounter
Admission: RE | Disposition: A | Discharge status: Home / Self Care | Payer: Self-pay | Source: Home / Self Care | Attending: Obstetrics & Gynecology

## 2015-09-01 ENCOUNTER — Inpatient Hospital Stay: Payer: Medicaid Other | Admitting: Anesthesiology

## 2015-09-01 DIAGNOSIS — Z302 Encounter for sterilization: Secondary | ICD-10-CM

## 2015-09-01 HISTORY — PX: TUBAL LIGATION: SHX77

## 2015-09-01 LAB — CBC
HEMATOCRIT: 29.3 % — AB (ref 35.0–47.0)
Hemoglobin: 10.3 g/dL — ABNORMAL LOW (ref 12.0–16.0)
MCH: 29 pg (ref 26.0–34.0)
MCHC: 35.3 g/dL (ref 32.0–36.0)
MCV: 82.1 fL (ref 80.0–100.0)
PLATELETS: 146 10*3/uL — AB (ref 150–440)
RBC: 3.56 MIL/uL — AB (ref 3.80–5.20)
RDW: 13.3 % (ref 11.5–14.5)
WBC: 7.8 10*3/uL (ref 3.6–11.0)

## 2015-09-01 LAB — RPR: RPR: NONREACTIVE

## 2015-09-01 SURGERY — LIGATION, FALLOPIAN TUBE, POSTPARTUM
Anesthesia: General | Laterality: Bilateral

## 2015-09-01 MED ORDER — EPHEDRINE SULFATE 50 MG/ML IJ SOLN
INTRAMUSCULAR | Status: DC | PRN
  Administered 2015-09-01: 10 mg via INTRAVENOUS

## 2015-09-01 MED ORDER — FENTANYL CITRATE (PF) 100 MCG/2ML IJ SOLN
INTRAMUSCULAR | Status: AC
  Filled 2015-09-01: qty 2

## 2015-09-01 MED ORDER — ONDANSETRON HCL 4 MG/2ML IJ SOLN: 4.0000 mg | Freq: Once | INTRAMUSCULAR | Status: DC | PRN

## 2015-09-01 MED ORDER — MIDAZOLAM HCL 2 MG/2ML IJ SOLN
INTRAMUSCULAR | Status: DC | PRN
  Administered 2015-09-01: 2 mg via INTRAVENOUS

## 2015-09-01 MED ORDER — OXYCODONE-ACETAMINOPHEN 5-325 MG PO TABS
1.0000 | ORAL_TABLET | ORAL | Status: DC | PRN
Start: 2015-09-01 — End: 2015-09-02
  Administered 2015-09-02: 1 via ORAL
  Filled 2015-09-01: qty 1

## 2015-09-01 MED ORDER — OXYCODONE-ACETAMINOPHEN 5-325 MG PO TABS
2.0000 | ORAL_TABLET | Freq: Four times a day (QID) | ORAL | Status: DC | PRN
  Administered 2015-09-01: 2 via ORAL

## 2015-09-01 MED ORDER — LIDOCAINE HCL (CARDIAC) 20 MG/ML IV SOLN
INTRAVENOUS | Status: DC | PRN
  Administered 2015-09-01: 30 mg via INTRAVENOUS

## 2015-09-01 MED ORDER — DEXAMETHASONE SODIUM PHOSPHATE 10 MG/ML IJ SOLN
INTRAMUSCULAR | Status: DC | PRN
  Administered 2015-09-01: 5 mg via INTRAVENOUS

## 2015-09-01 MED ORDER — OXYCODONE-ACETAMINOPHEN 5-325 MG PO TABS
ORAL_TABLET | ORAL | Status: AC
  Filled 2015-09-01: qty 2

## 2015-09-01 MED ORDER — ONDANSETRON HCL 4 MG/2ML IJ SOLN
INTRAMUSCULAR | Status: DC | PRN
  Administered 2015-09-01: 4 mg via INTRAVENOUS

## 2015-09-01 MED ORDER — PROPOFOL 10 MG/ML IV BOLUS
INTRAVENOUS | Status: DC | PRN
  Administered 2015-09-01: 160 mg via INTRAVENOUS

## 2015-09-01 MED ORDER — OXYCODONE-ACETAMINOPHEN 5-325 MG PO TABS
1.0000 | ORAL_TABLET | Freq: Four times a day (QID) | ORAL | Status: DC | PRN
  Administered 2015-09-01: 1 via ORAL
  Filled 2015-09-01: qty 1

## 2015-09-01 MED ORDER — SUCCINYLCHOLINE CHLORIDE 20 MG/ML IJ SOLN
INTRAMUSCULAR | Status: DC | PRN
  Administered 2015-09-01: 100 mg via INTRAVENOUS

## 2015-09-01 MED ORDER — FENTANYL CITRATE (PF) 100 MCG/2ML IJ SOLN
25.0000 ug | INTRAMUSCULAR | Status: DC | PRN
  Administered 2015-09-01 (×3): 25 ug via INTRAVENOUS

## 2015-09-01 MED ORDER — KETOROLAC TROMETHAMINE 30 MG/ML IJ SOLN
INTRAMUSCULAR | Status: DC | PRN
  Administered 2015-09-01: 30 mg via INTRAVENOUS

## 2015-09-01 MED ORDER — OXYCODONE-ACETAMINOPHEN 5-325 MG PO TABS
2.0000 | ORAL_TABLET | ORAL | Status: DC | PRN
  Administered 2015-09-01 – 2015-09-02 (×2): 2 via ORAL
  Filled 2015-09-01 (×2): qty 2

## 2015-09-01 MED ORDER — FENTANYL CITRATE (PF) 100 MCG/2ML IJ SOLN
INTRAMUSCULAR | Status: DC | PRN
  Administered 2015-09-01: 100 ug via INTRAVENOUS

## 2015-09-01 MED ORDER — BUPIVACAINE-EPINEPHRINE (PF) 0.25% -1:200000 IJ SOLN
INTRAMUSCULAR | Status: AC
  Filled 2015-09-01: qty 30

## 2015-09-01 MED ORDER — BUPIVACAINE-EPINEPHRINE 0.25% -1:200000 IJ SOLN
INTRAMUSCULAR | Status: DC | PRN
  Administered 2015-09-01: 1 mL

## 2015-09-01 MED ORDER — LACTATED RINGERS IV SOLN
INTRAVENOUS | Status: DC | PRN
  Administered 2015-09-01: 08:00:00 via INTRAVENOUS

## 2015-09-01 SURGICAL SUPPLY — 25 items
BLADE SURG 11 STRL SS SAFETY (MISCELLANEOUS) ×2 IMPLANT
CHLORAPREP W/TINT 26ML (MISCELLANEOUS) ×2 IMPLANT
DRAPE LAPAROTOMY 100X77 ABD (DRAPES) ×2 IMPLANT
ELECT CAUTERY BLADE 6.4 (BLADE) ×2 IMPLANT
ELECT REM PT RETURN 9FT ADLT (ELECTROSURGICAL) ×2
ELECTRODE REM PT RTRN 9FT ADLT (ELECTROSURGICAL) ×1 IMPLANT
GLOVE BIO SURGEON STRL SZ7 (GLOVE) ×4 IMPLANT
GLOVE BIOGEL PI IND STRL 7.5 (GLOVE) ×1 IMPLANT
GLOVE BIOGEL PI INDICATOR 7.5 (GLOVE) ×1
GOWN STRL REUS W/ TWL LRG LVL3 (GOWN DISPOSABLE) ×2 IMPLANT
GOWN STRL REUS W/ TWL XL LVL3 (GOWN DISPOSABLE) ×1 IMPLANT
GOWN STRL REUS W/TWL LRG LVL3 (GOWN DISPOSABLE) ×4
GOWN STRL REUS W/TWL XL LVL3 (GOWN DISPOSABLE)
KIT RM TURNOVER CYSTO AR (KITS) ×2 IMPLANT
LABEL OR SOLS (LABEL) ×2 IMPLANT
LIQUID BAND (GAUZE/BANDAGES/DRESSINGS) ×2 IMPLANT
NDL SAFETY 22GX1.5 (NEEDLE) ×2 IMPLANT
NS IRRIG 500ML POUR BTL (IV SOLUTION) ×2 IMPLANT
PACK BASIN MINOR ARMC (MISCELLANEOUS) ×2 IMPLANT
SUT PLAIN GUT 0 (SUTURE) ×4 IMPLANT
SUT VIC AB 2-0 UR6 27 (SUTURE) ×2 IMPLANT
SUT VIC AB 3-0 SH 27 (SUTURE) ×2
SUT VIC AB 3-0 SH 27X BRD (SUTURE) IMPLANT
SUT VIC AB 4-0 FS2 27 (SUTURE) ×1 IMPLANT
SYRINGE 10CC LL (SYRINGE) ×2 IMPLANT

## 2015-09-01 NOTE — Progress Notes (Signed)
Patient ID: Mary Maddox, female   DOB: 26-Dec-1985, 30 y.o.   MRN: [id number] Obstetric Postpartum Daily Progress Note Subjective:  30 y.o. [id number] postpartum day #1 status post vaginal delivery.  She is ambulating, is tolerating po but has not eaten since midnight, is voiding spontaneously.  Her pain is well controlled on PO pain medications. Her lochia is less than menses.   Medications SCHEDULED MEDICATIONS  . docusate sodium  100 mg Oral Daily  . ferrous sulfate  325 mg Oral Q breakfast  . prenatal multivitamin  1 tablet Oral Q1200    MEDICATION INFUSIONS     PRN MEDICATIONS  benzocaine-Menthol, coconut oil, witch hazel-glycerin **AND** dibucaine, ibuprofen, ondansetron **OR** ondansetron (ZOFRAN) IV, oxyCODONE-acetaminophen, oxyCODONE-acetaminophen, simethicone    Objective:   Filed Vitals:   08/31/15 2117 08/31/15 2259 09/01/15 0521 09/01/15 0725  BP:  115/70 114/69 111/71  Pulse:  65 54 50  Temp: 98.3 F (36.8 C) 98 F (36.7 C) 98.6 F (37 C) 98.6 F (37 C)  TempSrc: Oral Oral Oral Oral  Resp:  17 17 16   Height:      Weight:      SpO2:  99% 98% 100%    Current Vital Signs 24h Vital Sign Ranges  T 98.6 F (37 C) Temp  Avg: 98.5 F (36.9 C)  Min: 98 F (36.7 C)  Max: 98.9 F (37.2 C)  BP 111/71 mmHg BP  Min: 104/57  Max: 133/85  HR (!) 50 Pulse  Avg: 58.1  Min: 50  Max: 66  RR 16 Resp  Avg: 16.4  Min: 16  Max: 17  SaO2 100 % Not Delivered SpO2  Avg: 98.9 %  Min: 97 %  Max: 100 %       24 Hour I/O Current Shift I/O  Time Ins Outs 07/01 0701 - 07/02 0700 In: 2931.8 [I.V.:2917.7] Out: 1175 [Urine:775]    General: NAD Pulmonary: no increased work of breathing Abdomen: non-distended, non-tender, fundus firm at level of umbilicus Extremities: no edema, no erythema, no tenderness  Labs:   Recent Labs Lab 08/30/15 2117 08/31/15 1733 09/01/15 0513  WBC 7.5  --  7.8  HGB 11.8*  --  10.3*  HCT 33.3*  --  29.3*  PLT 160 148* 146*     Assessment:   30  y.o. [id number] postpartum day # 1 status post Spontaneous vaginal delivery  Plan:   1) Acute blood loss anemia - hemodynamically stable and asymptomatic - po ferrous sulfate  2) O POS / Rubella Immune (11/28 0000)/ Varicella Immune  3) contraception plain is BTL today. She understands the risks/benefits/alternatives of the procedure. She understands that there is a failure rate of 3-5 in every 1,000 procedures and that if she does get pregnant, the risk of ectopic pregnancy is higher than it would be if she hadn't had the procedure at all.  She understands the risks of the surgery. She wishes to proceed.  To OR this AM   Prentice Docker, MD 09/01/2015 7:37 AM

## 2015-09-01 NOTE — Anesthesia Procedure Notes (Signed)
Procedure Name: Intubation Performed by: Jarius Dieudonne Pre-anesthesia Checklist: Patient identified, Patient being monitored, Timeout performed, Emergency Drugs available and Suction available Patient Re-evaluated:Patient Re-evaluated prior to inductionOxygen Delivery Method: Circle system utilized Preoxygenation: Pre-oxygenation with 100% oxygen Intubation Type: IV induction Ventilation: Mask ventilation without difficulty Laryngoscope Size: Mac and 3 Grade View: Grade I Tube type: Oral Tube size: 7.0 mm Number of attempts: 1 Airway Equipment and Method: Stylet Placement Confirmation: ETT inserted through vocal cords under direct vision,  positive ETCO2 and breath sounds checked- equal and bilateral Secured at: 21 cm Tube secured with: Tape Dental Injury: Teeth and Oropharynx as per pre-operative assessment        

## 2015-09-01 NOTE — OR Nursing (Signed)
Patient on bedpan voided good amount

## 2015-09-01 NOTE — Anesthesia Preprocedure Evaluation (Signed)
Anesthesia Evaluation  Patient identified by MRN, date of birth, ID band Patient awake    Reviewed: Allergy & Precautions, NPO status , Patient's Chart, lab work & pertinent test results  History of Anesthesia Complications Negative for: history of anesthetic complications  Airway Mallampati: II       Dental   Pulmonary Current Smoker,           Cardiovascular negative cardio ROS       Neuro/Psych negative neurological ROS  negative psych ROS   GI/Hepatic negative GI ROS, Neg liver ROS,   Endo/Other  negative endocrine ROS  Renal/GU negative Renal ROS  negative genitourinary   Musculoskeletal negative musculoskeletal ROS (+)   Abdominal   Peds negative pediatric ROS (+)  Hematology negative hematology ROS (+)   Anesthesia Other Findings   Reproductive/Obstetrics (+) Pregnancy                             Anesthesia Physical  Anesthesia Plan  ASA: II and emergent  Anesthesia Plan: General   Post-op Pain Management:    Induction:   Airway Management Planned: Oral ETT  Additional Equipment:   Intra-op Plan:   Post-operative Plan:   Informed Consent: I have reviewed the patients History and Physical, chart, labs and discussed the procedure including the risks, benefits and alternatives for the proposed anesthesia with the patient or authorized representative who has indicated his/her understanding and acceptance.     Plan Discussed with:   Anesthesia Plan Comments:         Anesthesia Quick Evaluation

## 2015-09-01 NOTE — Anesthesia Postprocedure Evaluation (Signed)
Anesthesia Post Note  Patient: Mary Maddox  Procedure(s) Performed: * No procedures listed *  Patient location during evaluation: PACU Anesthesia Type: Epidural Level of consciousness: awake and alert Pain management: pain level controlled Vital Signs Assessment: post-procedure vital signs reviewed and stable Respiratory status: spontaneous breathing, nonlabored ventilation and respiratory function stable Cardiovascular status: stable Postop Assessment: no headache, no backache and epidural receding Anesthetic complications: no    Last Vitals:  Filed Vitals:   09/01/15 0521 09/01/15 0725  BP: 114/69 111/71  Pulse: 54 50  Temp: 37 C 37 C  Resp: 17 16    Last Pain:  Filed Vitals:   09/01/15 0740  PainSc: 6                  KEPHART,WILLIAM K

## 2015-09-01 NOTE — Anesthesia Postprocedure Evaluation (Signed)
Anesthesia Post Note  Patient: Mary Maddox  Procedure(s) Performed: Procedure(s) (LRB): POST PARTUM TUBAL LIGATION (Bilateral)  Patient location during evaluation: PACU Anesthesia Type: General Level of consciousness: awake and alert Pain management: pain level controlled Vital Signs Assessment: post-procedure vital signs reviewed and stable Respiratory status: spontaneous breathing and respiratory function stable Cardiovascular status: stable Anesthetic complications: no    Last Vitals:  Filed Vitals:   09/01/15 0931 09/01/15 0933  BP:  124/82  Pulse: 64 64  Temp:  36.3 C  Resp: 13 14    Last Pain:  Filed Vitals:   09/01/15 0934  PainSc: 5                  KEPHART,WILLIAM K

## 2015-09-01 NOTE — Op Note (Signed)
Op Note Postpartum Tubal Ligation  Pre-Op Diagnosis: multiparity, desires permanent sterilization  Post-Op Diagnosis: multiparity, desires permanent sterilization  Procedures:  Postpartum tubal ligation via Pomeroy method  Primary Surgeon: Prentice Docker, MD  EBL: 10 ml   IVF: 1,000 mL   Specimens: portion of right and left fallopian tubes  Drains: None  Complications: None   Disposition: PACU   Condition: Stable   Findings: normal appearing bilateral fallopian tubes  Indication: The patient is a 31 y.o. [id number] who is postpartum day 1 status post spontaneous vaginal delivery.  She has been counseled extensively regarding risks, benefits, and alternatives to tubal ligation, including non-permanent forms of contraception that are equivalent in efficacy with potentially better side effects.  She has been advised that there is a failure rate of 3-5 in every 1,000 tubal ligations per year with an increased risk of ectopic pregnancy should pregnancy occur.   Procedure Summary:  The patient was taken to the operating room where general anesthesia was administered and found to be adequate. After timeout was called a small transverse, infraumbilical incision was made with the scalpel. The incision was carried down through the fascia until the peritoneum was identified and entered. The peritoneum was noted to be free of any adhesions and the incision was then extended.  The patient's right fallopian tube was identified, brought incision, and grasped with a Babcock clamp. The tube was then followed out to the fimbria. The Babcock clamp was then used to grasp the tube approximately 4 cm from the cornual region. A 3 cm segment of tube was then ligated with the 2 free ties of plain gut, and excised. Good hemostasis was noted and the tube was returned to the abdomen. The left fallopian tube was then ligated, and a 3 cm segment excised in a similar fashion. Excellent hemostasis was noted, and the  tube returned to the abdomen.  The peritoneum and fascia were closed in a single layer using 3-0 Vicryl. The skin was closed in a subcuticular fashion using 4-0 vicryl, undyed. The closure was also closed with Dermabond.  The patient tolerated the procedure well. Sponge, lap, and needle counts were correct 2. The patient was taken to the recovery room in stable condition.   Sponge, lap, needle, and instrument counts were correct x 2.  VTE prophylaxis: SCDs. Antibiotic prophylaxis: none indicated. The patient tolerated the procedure well and was taken to the PACU in stable condition.   Prentice Docker, MD 09/01/2015 8:52 AM

## 2015-09-01 NOTE — Transfer of Care (Signed)
Immediate Anesthesia Transfer of Care Note  Patient: Mary Maddox  Procedure(s) Performed: Procedure(s): POST PARTUM TUBAL LIGATION (Bilateral)  Patient Location: PACU  Anesthesia Type:General  Level of Consciousness: awake, alert  and oriented  Airway & Oxygen Therapy: Patient Spontanous Breathing and Patient connected to nasal cannula oxygen  Post-op Assessment: Report given to RN  Post vital signs: Reviewed and stable  Last Vitals:  Filed Vitals:   09/01/15 0725 09/01/15 0854  BP: 111/71   Pulse: 50   Temp: 37 C 36.3 C  Resp: 16     Last Pain:  Filed Vitals:   09/01/15 0856  PainSc: 8          Complications: No apparent anesthesia complications

## 2015-09-02 ENCOUNTER — Encounter: Payer: Self-pay | Admitting: Obstetrics and Gynecology

## 2015-09-02 MED ORDER — IBUPROFEN 600 MG PO TABS: 600.0000 mg | ORAL_TABLET | Freq: Four times a day (QID) | ORAL | Status: DC | PRN

## 2015-09-02 MED ORDER — OXYCODONE-ACETAMINOPHEN 5-325 MG PO TABS: 1.0000 | ORAL_TABLET | ORAL | Status: DC | PRN

## 2015-09-02 NOTE — Progress Notes (Signed)
Patient understands all discharge instructions and the need to make follow up appointments. Patient discharge via wheelchair with auxillary. 

## 2015-09-05 LAB — SURGICAL PATHOLOGY

## 2015-09-11 ENCOUNTER — Encounter: Payer: Self-pay | Admitting: Certified Nurse Midwife

## 2016-12-16 ENCOUNTER — Encounter: Payer: Self-pay | Admitting: *Deleted

## 2016-12-16 ENCOUNTER — Encounter (INDEPENDENT_AMBULATORY_CARE_PROVIDER_SITE_OTHER): Payer: Self-pay

## 2016-12-16 ENCOUNTER — Ambulatory Visit: Payer: Self-pay | Attending: Oncology | Admitting: *Deleted

## 2016-12-16 VITALS — BP 144/90 | HR 69 | Temp 98.2°F | Ht 67.0 in | Wt 147.0 lb

## 2016-12-16 DIAGNOSIS — N63 Unspecified lump in unspecified breast: Secondary | ICD-10-CM

## 2016-12-16 NOTE — Patient Instructions (Signed)
Gave patient hand-out, Women Staying Healthy, Active and Well from [facility], with education on breast health, pap smears, heart and colon health. 

## 2016-12-16 NOTE — Progress Notes (Signed)
Subjective:     Patient ID: Mary Maddox, female   DOB: [birth year], 31 y.o.   MRN: [id number]  HPI   Review of Systems     Objective:   Physical Exam  Pulmonary/Chest: Right breast exhibits nipple discharge. Right breast exhibits no inverted nipple, no mass, no skin change and no tenderness. Left breast exhibits mass and nipple discharge. Left breast exhibits no inverted nipple, no skin change and no tenderness. Breasts are symmetrical.         Assessment:     31 year old White female presented to [facility] yesterday with complaints of bilateral nipple discharge and a left breast mass.  [place] had the patient call to see if she was eligible for [facility].  Patient is here today for further evaluation.  Patient states she had spontaneous green left nipple discharge for several weeks, then a couple of days ago she noticed right nipple discharge.  States after a few weeks of left nipple discharge she noticed a left breast mass at 12:00.  States she has some radiating breast pain through the left nipple.  On clinical breast exam I can palpate an asymmetrical thickening at 6:00 left breast.  Unable to palpate a mass at the area of concern.  Patient was not able to palpate the area either in supine position.  Had her sit in semi-fowlers position and she states she can feel the area.  I can palpate a very tiny approximate 4 mm nodule at 12:00 left breast.  There is no nipple discharge of the left nipple today, but I can express green discharge of the right nipple from one duct at the 3:00 position.  Patient has a significant family history of cancer as follows:mom with ovarian/uterine/cervical cancer diagnosed in her 44's and deceased early 66's, maternal aunt had uterine cancer and a maternal grandmother had uterine cancer.  Taught self breast awareness.  Patient has been screened for eligibility.  She does not have any insurance, Medicare or Medicaid.  She also meets financial eligibility.  Hand-out  given on the Affordable Care Act.     Plan:     Will have Jeanella Anton schedule the patient to be seen by Dr. Bary Castilla for surgical consult for further evaluation of the bilateral nipple discharge and left breast nodule.  Will follow-up per [facility] protocol.

## 2016-12-22 ENCOUNTER — Encounter: Payer: Self-pay | Admitting: General Surgery

## 2016-12-22 ENCOUNTER — Inpatient Hospital Stay: Payer: Self-pay

## 2016-12-22 ENCOUNTER — Ambulatory Visit (INDEPENDENT_AMBULATORY_CARE_PROVIDER_SITE_OTHER): Payer: Self-pay | Admitting: General Surgery

## 2016-12-22 VITALS — BP 142/92 | Ht 67.0 in | Wt 147.8 lb

## 2016-12-22 DIAGNOSIS — N6321 Unspecified lump in the left breast, upper outer quadrant: Secondary | ICD-10-CM

## 2016-12-22 DIAGNOSIS — N6452 Nipple discharge: Secondary | ICD-10-CM

## 2016-12-22 NOTE — Progress Notes (Signed)
Patient ID: Mary Maddox, female   DOB: 10/10/1985, 31 y.o.   MRN: [id number]  Chief Complaint  Patient presents with  . Pain    HPI Mary Maddox is a 30 y.o. female here today for a evaluation of bilateral breast discharge referred by Tanya Nones RN [facility]. Patient noticed a lump in her left breast about a month ago.  She has noticed nipple discharge that is greenish in color for about 1-2 months, right nipple daily Denies any breast injury or trauma. Occasional left breast pain, "shooting pain" is random. Last pregnancy was 1 year ago, she was not able to breast feed her daughter but did the other 3 boys.   HPI  Past Medical History:  Diagnosis Date  . Anxiety   . Crohn's disease in remission ([place])   . Depression   . Hypokalemia     Past Surgical History:  Procedure Laterality Date  . LEEP  2013  . TUBAL LIGATION Bilateral 09/01/2015   Procedure: POST PARTUM TUBAL LIGATION;  Surgeon: Will Bonnet, MD;  Location: [facility] ORS;  Service: Gynecology;  Laterality: Bilateral;  . UMBILICAL HERNIA REPAIR      Family History  Problem Relation Age of Onset  . Hypertension Unknown   . Diabetes Unknown   . Breast cancer Paternal Grandmother   . Colon cancer Neg Hx     Social History Social History  Substance Use Topics  . Smoking status: Current Every Day Smoker    Packs/day: 0.25    Years: 10.00    Types: Cigarettes  . Smokeless tobacco: Never Used  . Alcohol use No     Comment: occasional before pregnancy    Allergies  Allergen Reactions  . Darvon [Propoxyphene] Hives    Current Outpatient Prescriptions  Medication Sig Dispense Refill  . ibuprofen (ADVIL,MOTRIN) 600 MG tablet Take 1 tablet (600 mg total) by mouth every 6 (six) hours as needed for mild pain, moderate pain or cramping. 30 tablet 0  . Prenatal Vit-Fe Fumarate-FA (MULTIVITAMIN-PRENATAL) 27-0.8 MG TABS tablet Take 1 tablet by mouth daily at 12 noon.     No current facility-administered medications  for this visit.     Review of Systems Review of Systems  Constitutional: Negative.   Respiratory: Negative.   Cardiovascular: Negative.     Blood pressure (!) 142/92, height 5\' 7"  (1.702 m), weight 147 lb 12.8 oz (67 kg), last menstrual period 12/15/2016, not currently breastfeeding.  Physical Exam Physical Exam  Constitutional: She is oriented to person, place, and time. She appears well-developed and well-nourished.  HENT:  Mouth/Throat: Oropharynx is clear and moist.  Eyes: Conjunctivae are normal. No scleral icterus.  Neck: Neck supple.  Cardiovascular: Normal rate, regular rhythm and normal heart sounds.   Pulmonary/Chest: Effort normal and breath sounds normal. Right breast exhibits nipple discharge. Right breast exhibits no inverted nipple, no mass, no skin change and no tenderness. Left breast exhibits mass. Left breast exhibits no inverted nipple, no nipple discharge, no skin change and no tenderness.    1 cm smooth sphere nodule base left nipple at 3 o'clock. Thickening behind right nipple at 4 o'clock with nipple drainage from 2 ducts.  Lymphadenopathy:    She has no cervical adenopathy.  Neurological: She is alert and oriented to person, place, and time.  Skin: Skin is warm and dry.  Psychiatric: Her behavior is normal.    Data Reviewed  December 16, 2016 notes from [place], [place] reviewed.  The nodular areas in the left  breast were not clearly detectable on today's exam.  This may be related to timing of her most recent menses.  Ultrasound examination was completed of the retroareolar area in the 1 o'clock position of the left breast.  There is a well-defined smoothly marginated nodule without vascular flow measuring 0.5 x 1.0 x 1.4 cm.  This is at the 3 o'clock position at the base of the nipple.  Scanning of the right breast in the 2 o'clock position again at the base of the nipple showed a similar 0.7 x 0.8 x 1.1 cm smoothly marginated nodule.  Neither lesion  shows significant acoustic enhancement or shadowing.  No vascular flow in either lesion.  These likely represent inspissated secretion within the milk ducts.  BI-RADS-3.  Assessment    Nipple drainage, likely related to ongoing tobacco use.    Plan    The patient reports she is significantly decreased her smoking, and I reviewed with her that it may take 3 -6 months for the increased viscosity in the milk ducts to resolve.  Local heat can be of benefit, anti-inflammatories for discomfort.  While these 2 areas could be resected, as long as she is still smoking she is at high risk of developing a fistula from the duct which would be certainly more irritating than the nipple discharge she is experiencing at present.    Recommend smoking cessation and observe for improvement. The patient is aware to use a heating pad as needed for comfort.  Follow up in December.  HPI, Physical Exam, Assessment and Plan have been scribed under the direction and in the presence of Robert Bellow, MD. Karie Fetch, RN  I have completed the exam and reviewed the above documentation for accuracy and completeness.  I agree with the above.  Haematologist has been used and any errors in dictation or transcription are unintentional.  Hervey Ard, M.D., F.A.C.S.    Robert Bellow 12/23/2016, 4:09 PM

## 2016-12-22 NOTE — Patient Instructions (Addendum)
The patient is aware to call back for any questions or new concerns. Recommend smoking cessation and observe for improvement.  The patient is aware to use a heating pad as needed for comfort.  Follow up in December

## 2016-12-23 DIAGNOSIS — N6452 Nipple discharge: Secondary | ICD-10-CM | POA: Insufficient documentation

## 2016-12-23 DIAGNOSIS — N6321 Unspecified lump in the left breast, upper outer quadrant: Secondary | ICD-10-CM | POA: Insufficient documentation

## 2017-01-12 ENCOUNTER — Encounter: Payer: Self-pay | Admitting: *Deleted

## 2017-01-12 NOTE — Progress Notes (Signed)
Dr. Bary Castilla to follow up with patient again in December.  [facility] to [place].

## 2017-01-19 ENCOUNTER — Encounter: Payer: Self-pay | Admitting: Radiology

## 2017-01-19 ENCOUNTER — Emergency Department
Admission: EM | Admit: 2017-01-19 | Discharge: 2017-01-19 | Disposition: A | Discharge status: Home / Self Care | Payer: No Typology Code available for payment source | Attending: Emergency Medicine | Admitting: Emergency Medicine

## 2017-01-19 ENCOUNTER — Emergency Department: Payer: No Typology Code available for payment source

## 2017-01-19 ENCOUNTER — Other Ambulatory Visit: Payer: Self-pay

## 2017-01-19 DIAGNOSIS — M25551 Pain in right hip: Secondary | ICD-10-CM | POA: Diagnosis present

## 2017-01-19 DIAGNOSIS — F17210 Nicotine dependence, cigarettes, uncomplicated: Secondary | ICD-10-CM | POA: Insufficient documentation

## 2017-01-19 DIAGNOSIS — R220 Localized swelling, mass and lump, head: Secondary | ICD-10-CM | POA: Diagnosis not present

## 2017-01-19 DIAGNOSIS — Y999 Unspecified external cause status: Secondary | ICD-10-CM | POA: Insufficient documentation

## 2017-01-19 DIAGNOSIS — Y929 Unspecified place or not applicable: Secondary | ICD-10-CM | POA: Diagnosis not present

## 2017-01-19 DIAGNOSIS — F10120 Alcohol abuse with intoxication, uncomplicated: Secondary | ICD-10-CM | POA: Insufficient documentation

## 2017-01-19 DIAGNOSIS — S71011A Laceration without foreign body, right hip, initial encounter: Secondary | ICD-10-CM | POA: Diagnosis not present

## 2017-01-19 DIAGNOSIS — F10920 Alcohol use, unspecified with intoxication, uncomplicated: Secondary | ICD-10-CM

## 2017-01-19 DIAGNOSIS — Y939 Activity, unspecified: Secondary | ICD-10-CM | POA: Insufficient documentation

## 2017-01-19 LAB — ETHANOL: ALCOHOL ETHYL (B): 230 mg/dL — AB (ref <10)

## 2017-01-19 LAB — CBC
HCT: 39.9 % (ref 35.0–47.0)
Hemoglobin: 13.3 g/dL (ref 12.0–16.0)
MCH: 26.6 pg (ref 26.0–34.0)
MCHC: 33.3 g/dL (ref 32.0–36.0)
MCV: 79.8 fL — AB (ref 80.0–100.0)
Platelets: 254 10*3/uL (ref 150–440)
RBC: 5.01 MIL/uL (ref 3.80–5.20)
RDW: 15.4 % — AB (ref 11.5–14.5)
WBC: 5.9 10*3/uL (ref 3.6–11.0)

## 2017-01-19 LAB — COMPREHENSIVE METABOLIC PANEL
ALT: 14 U/L (ref 14–54)
ANION GAP: 8 (ref 5–15)
AST: 20 U/L (ref 15–41)
Albumin: 4.2 g/dL (ref 3.5–5.0)
Alkaline Phosphatase: 69 U/L (ref 38–126)
BILIRUBIN TOTAL: 0.5 mg/dL (ref 0.3–1.2)
BUN: 12 mg/dL (ref 6–20)
CO2: 22 mmol/L (ref 22–32)
Calcium: 9 mg/dL (ref 8.9–10.3)
Chloride: 111 mmol/L (ref 101–111)
Creatinine, Ser: 0.68 mg/dL (ref 0.44–1.00)
GFR calc Af Amer: >60 mL/min (ref >60)
Glucose, Bld: 98 mg/dL (ref 65–99)
POTASSIUM: 3.5 mmol/L (ref 3.5–5.1)
Sodium: 141 mmol/L (ref 135–145)
TOTAL PROTEIN: 7.2 g/dL (ref 6.5–8.1)

## 2017-01-19 MED ORDER — LIDOCAINE HCL (PF) 1 % IJ SOLN
INTRAMUSCULAR | Status: AC
  Filled 2017-01-19: qty 5

## 2017-01-19 MED ORDER — IOPAMIDOL (ISOVUE-300) INJECTION 61%
100.0000 mL | Freq: Once | INTRAVENOUS | Status: AC | PRN
  Administered 2017-01-19: 100 mL via INTRAVENOUS
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
## 2017-01-19 NOTE — ED Triage Notes (Signed)
Pt admits to taking Adderall recently that was not rx'd to her.
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
## 2017-01-25 ENCOUNTER — Encounter: Payer: Self-pay | Admitting: General Surgery

## 2017-01-25 ENCOUNTER — Inpatient Hospital Stay: Payer: Self-pay

## 2017-01-25 ENCOUNTER — Ambulatory Visit (INDEPENDENT_AMBULATORY_CARE_PROVIDER_SITE_OTHER): Payer: Self-pay | Admitting: General Surgery

## 2017-01-25 VITALS — BP 116/76 | HR 82 | Resp 12 | Ht 67.0 in | Wt 150.0 lb

## 2017-01-25 DIAGNOSIS — N644 Mastodynia: Secondary | ICD-10-CM | POA: Insufficient documentation
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
## 2017-01-25 NOTE — Patient Instructions (Signed)
Send in a My Chart message in 3 weeks with a progress report Take you Prenatal Vitamins daily. Take Advil/ Ibuprofen 3, 200 mg tablets 3 times a day. Wear a well fitting bra

## 2017-01-27 ENCOUNTER — Emergency Department
Admission: EM | Admit: 2017-01-27 | Discharge: 2017-01-27 | Disposition: A | Discharge status: Home / Self Care | Payer: Self-pay | Attending: Emergency Medicine | Admitting: Emergency Medicine

## 2017-01-27 ENCOUNTER — Other Ambulatory Visit: Payer: Self-pay

## 2017-01-27 ENCOUNTER — Encounter: Payer: Self-pay | Admitting: Emergency Medicine

## 2017-01-27 DIAGNOSIS — F17210 Nicotine dependence, cigarettes, uncomplicated: Secondary | ICD-10-CM | POA: Insufficient documentation

## 2017-01-27 DIAGNOSIS — Z4802 Encounter for removal of sutures: Secondary | ICD-10-CM

## 2017-01-27 DIAGNOSIS — Z79899 Other long term (current) drug therapy: Secondary | ICD-10-CM | POA: Insufficient documentation

## 2017-01-27 DIAGNOSIS — S71011D Laceration without foreign body, right hip, subsequent encounter: Secondary | ICD-10-CM | POA: Insufficient documentation

## 2017-01-27 DIAGNOSIS — X58XXXD Exposure to other specified factors, subsequent encounter: Secondary | ICD-10-CM | POA: Insufficient documentation

## 2017-01-27 NOTE — ED Triage Notes (Signed)
Patient presents to the ED to have sutures removed from right hip that were placed last Monday.  Patient states one suture "popped out" last night.  Patient is in no obvious distress at this time.

## 2017-01-27 NOTE — ED Provider Notes (Signed)
[facility] Emergency Department Provider Note   ____________________________________________   None    (approximate)  I have reviewed the triage vital signs and the nursing notes.   HISTORY  Chief Complaint Suture / Staple Removal    HPI Mary Maddox is a 31 y.o. female patient presented for suture removals second large laceration right lateral hip tenderness go. Patient stated one of the sutures came loose yesterday.Patient denies signs symptoms secondary infection.   Past Medical History:  Diagnosis Date  . Anxiety   . Crohn's disease in remission [facility])    as child  . Depression   . Hypokalemia     Patient Active Problem List   Diagnosis Date Noted  . Pain of left breast 01/25/2017  . Mass of upper outer quadrant of left breast 12/23/2016    Past Surgical History:  Procedure Laterality Date  . LEEP  2013  . TUBAL LIGATION Bilateral 09/01/2015   Procedure: POST PARTUM TUBAL LIGATION;  Surgeon: Will Bonnet, MD;  Location: [facility] ORS;  Service: Gynecology;  Laterality: Bilateral;  . UMBILICAL HERNIA REPAIR      Prior to Admission medications   Medication Sig Start Date End Date Taking? Authorizing Provider  ibuprofen (ADVIL,MOTRIN) 200 MG tablet Take 400 mg by mouth every 4 (four) hours as needed.    [provider]  Prenatal Vit-Fe Fumarate-FA (MULTIVITAMIN-PRENATAL) 27-0.8 MG TABS tablet Take 1 tablet by mouth daily at 12 noon.    [provider]    Allergies Darvon [propoxyphene]  Family History  Problem Relation Age of Onset  . Hypertension Unknown   . Diabetes Unknown   . Breast cancer Paternal Grandmother   . Colon cancer Neg Hx     Social History Social History   Tobacco Use  . Smoking status: Current Every Day Smoker    Packs/day: 0.25    Years: 10.00    Pack years: 2.50    Types: Cigarettes  . Smokeless tobacco: Never Used  Substance Use Topics  . Alcohol use: Yes  . Drug use: No   Comment: adderall    Review of Systems Constitutional: No fever/chills Eyes: No visual changes. ENT: No sore throat. Cardiovascular: Denies chest pain. Respiratory: Denies shortness of breath. Gastrointestinal: No abdominal pain.  No nausea, no vomiting.  No diarrhea.  No constipation. Genitourinary: Negative for dysuria. Musculoskeletal: Negative for back pain. Skin: Negative for rash. Neurological: Negative for headaches, focal weakness or numbness. Psychiatric:Anxiety and depression Endocrine:Hypokalemia Allergic/Immunilogical: See medication list ____________________________________________   PHYSICAL EXAM:  VITAL SIGNS: ED Triage Vitals  Enc Vitals Group     BP 01/27/17 1232 (!) 137/95     Pulse Rate 01/27/17 1232 78     Resp 01/27/17 1232 18     Temp 01/27/17 1232 98.4 F (36.9 C)     Temp Source 01/27/17 1232 Oral     SpO2 01/27/17 1232 99 %     Weight 01/27/17 1232 140 lb (63.5 kg)     Height 01/27/17 1232 5\' 7"  (1.702 m)     Head Circumference --      Peak Flow --      Pain Score 01/27/17 1247 0     Pain Loc --      Pain Edu? --      Excl. in [place]? --    Constitutional: Alert and oriented. Well appearing and in no acute distress. Cardiovascular: Normal rate, regular rhythm. Grossly normal heart sounds.  Good peripheral circulation. Respiratory: Normal  respiratory effort.  No retractions. Lungs CTAB. Musculoskeletal: Right lateral thigh guarding with palpation.  Neurologic:  Normal speech and language. No gross focal neurologic deficits are appreciated. No gait instability. Skin:  Skin is warm, dry and intact. No rash noted. Healing laceration and ecchymosis Psychiatric: Mood and affect are normal. Speech and behavior are normal.  ____________________________________________   LABS (all labs ordered are listed, but only abnormal results are displayed)  Labs Reviewed - No data to  display ____________________________________________  EKG   ____________________________________________  RADIOLOGY  No results found.  ____________________________________________   PROCEDURES  Procedure(s) performed: None  Procedures  Critical Care performed: No  ____________________________________________   INITIAL IMPRESSION / ASSESSMENT AND PLAN / ED COURSE  As part of my medical decision making, I reviewed the following data within the [place]    Patient presents today for suture removal second digit laceration to the right lateral thigh. 5 simple interrupted sutures were removed. Area was clean and bandage. Patient given discharge Instructions. [facility].      ____________________________________________   FINAL CLINICAL IMPRESSION(S) / ED DIAGNOSES  Final diagnoses:  Encounter for removal of sutures     ED Discharge Orders    None       Note:  This document was prepared using Dragon voice recognition software and may include unintentional dictation errors.    Sable Feil, PA-C 01/27/17 1438    Darel Hong, MD 01/27/17 1610

## 2017-01-27 NOTE — ED Notes (Signed)
Unable to obtain D/C vitals or obtain E-sig, D/C instructions reviewed by PA and given to patient by PA. Pt visualized walking down hallway in no acute distress at this time.

## 2017-02-24 ENCOUNTER — Ambulatory Visit: Payer: Self-pay | Admitting: General Surgery

## 2017-03-11 ENCOUNTER — Ambulatory Visit (INDEPENDENT_AMBULATORY_CARE_PROVIDER_SITE_OTHER): Payer: Self-pay | Admitting: General Surgery

## 2017-03-11 ENCOUNTER — Encounter: Payer: Self-pay | Admitting: General Surgery

## 2017-03-11 VITALS — BP 148/88 | HR 96 | Resp 12 | Ht 67.0 in | Wt 151.0 lb

## 2017-03-11 DIAGNOSIS — N6452 Nipple discharge: Secondary | ICD-10-CM

## 2017-03-11 NOTE — Patient Instructions (Addendum)
The patient is aware to call back for any questions or new concerns.  Excision of the area of concern

## 2017-03-11 NOTE — Progress Notes (Signed)
Patient ID: Mary Maddox, female   DOB: [birth year], 32 y.o.   MRN: [id number]  Chief Complaint  Patient presents with  . Follow-up    HPI Mary Maddox is a 32 y.o. female.  Here for follow up left breast discharge. Patient states she still notices drainage from the left nipple spontaneously and with pressure. She does notice some breast pain prior to the drainage. The right breast is much better, only rare crust noted. The patient reports she discontinued smoking at the time of her November 2018 visit. She works for [employer].  HPI  Past Medical History:  Diagnosis Date  . Anxiety   . Crohn's disease in remission [facility])    as child  . Depression   . Hypokalemia     Past Surgical History:  Procedure Laterality Date  . LEEP  2013  . TUBAL LIGATION Bilateral 09/01/2015   Procedure: POST PARTUM TUBAL LIGATION;  Surgeon: Will Bonnet, MD;  Location: [facility] ORS;  Service: Gynecology;  Laterality: Bilateral;  . UMBILICAL HERNIA REPAIR      Family History  Problem Relation Age of Onset  . Hypertension Unknown   . Diabetes Unknown   . Breast cancer Paternal Grandmother   . Colon cancer Neg Hx     Social History Social History   Tobacco Use  . Smoking status: Former Smoker    Packs/day: 0.25    Years: 10.00    Pack years: 2.50    Types: Cigarettes    Last attempt to quit: 03/01/2017    Years since quitting: 0.0  . Smokeless tobacco: Never Used  Substance Use Topics  . Alcohol use: Yes  . Drug use: No    Comment: adderall    Allergies  Allergen Reactions  . Darvon [Propoxyphene] Hives    Current Outpatient Medications  Medication Sig Dispense Refill  . ibuprofen (ADVIL,MOTRIN) 200 MG tablet Take 400 mg by mouth every 4 (four) hours as needed.    . Prenatal Vit-Fe Fumarate-FA (MULTIVITAMIN-PRENATAL) 27-0.8 MG TABS tablet Take 1 tablet by mouth daily at 12 noon.     No current facility-administered medications for this visit.     Review of Systems Review  of Systems  Constitutional: Negative.   Respiratory: Negative.   Cardiovascular: Negative.     Blood pressure (!) 148/88, pulse 96, resp. rate 12, height 5\' 7"  (1.702 m), weight 151 lb (68.5 kg), not currently breastfeeding.  Physical Exam Physical Exam  Constitutional: She is oriented to person, place, and time. She appears well-developed and well-nourished.  HENT:  Mouth/Throat: Oropharynx is clear and moist. No oropharyngeal exudate.  Eyes: Conjunctivae are normal. No scleral icterus.  Neck: Neck supple.  Cardiovascular: Normal rate, regular rhythm and normal heart sounds.  Pulmonary/Chest: Effort normal and breath sounds normal. Right breast exhibits no inverted nipple, no mass, no nipple discharge, no skin change and no tenderness. Left breast exhibits no inverted nipple, no mass, no nipple discharge, no skin change and no tenderness.    Nodule 2 o'clock base of left nipple  Lymphadenopathy:    She has no cervical adenopathy.    She has no axillary adenopathy.  Neurological: She is alert and oriented to person, place, and time.  Skin: Skin is warm and dry.  Psychiatric: Her behavior is normal.     Assessment    Persistent nipple drainage, possible papilloma versus chronic abscess.    Plan    Options for management were reviewed: Observation versus excision of the retroareolar ductal structures.  Potential for delayed healing and pain reviewed.  Effort to maintain nipple height discussed.  Anticipate a 3-day return to work recovery.       HPI, Physical Exam, Assessment and Plan have been scribed under the direction and in the presence of Robert Bellow, MD. Karie Fetch, RN  I have completed the exam and reviewed the above documentation for accuracy and completeness.  I agree with the above.  Haematologist has been used and any errors in dictation or transcription are unintentional.  Hervey Ard, M.D., F.A.C.S.  Forest Gleason Tomeko [place] 03/11/2017, 4:24  PM  Patient's surgery has been scheduled for 03-22-17 at [facility].   Dominga Ferry, CMA

## 2017-03-15 ENCOUNTER — Encounter: Payer: Self-pay | Admitting: General Surgery

## 2017-03-15 ENCOUNTER — Encounter: Payer: Self-pay | Admitting: *Deleted

## 2017-03-16 ENCOUNTER — Other Ambulatory Visit: Payer: Self-pay

## 2017-03-16 ENCOUNTER — Ambulatory Visit
Admission: RE | Admit: 2017-03-16 | Discharge: 2017-03-16 | Disposition: A | Discharge status: Home / Self Care | Payer: No Typology Code available for payment source | Source: Ambulatory Visit | Attending: General Surgery | Admitting: General Surgery

## 2017-03-16 HISTORY — DX: Anemia, unspecified: D64.9

## 2017-03-16 HISTORY — DX: Family history of other specified conditions: Z84.89

## 2017-03-16 HISTORY — DX: Headache, unspecified: R51.9

## 2017-03-16 HISTORY — DX: Malignant (primary) neoplasm, unspecified: C80.1

## 2017-03-16 HISTORY — DX: Elevated blood-pressure reading, without diagnosis of hypertension: R03.0

## 2017-03-16 HISTORY — DX: Headache: R51

## 2017-03-16 HISTORY — DX: Pneumonia, unspecified organism: J18.9

## 2017-03-16 NOTE — Patient Instructions (Signed)
  Your procedure is scheduled on: 03-22-17 MONDAY Report to Same Day Surgery 2nd floor medical mall [facility] Entrance-take elevator on left to 2nd floor.  Check in with surgery information desk.) To find out your arrival time please call [phone] between 1PM - 3PM on 03-19-17 FRIDAY  Remember: Instructions that are not followed completely may result in serious medical risk, up to and including death, or upon the discretion of your surgeon and anesthesiologist your surgery may need to be rescheduled.    _x___ 1. Do not eat food after midnight the night before your procedure. NO GUM OR CANDY AFTER MIDNIGHT.  You may drink clear liquids up to 2 hours before you are scheduled to arrive at the hospital for your procedure.  Do not drink clear liquids within 2 hours of your scheduled arrival to the hospital.  Clear liquids include  --Water or Apple juice without pulp  --Clear carbohydrate beverage such as ClearFast or Gatorade  --Black Coffee or Clear Tea (No milk, no creamers, do not add anything to the coffee or Tea     __x__ 2. No Alcohol for 24 hours before or after surgery.   __x__3. No Smoking for 24 prior to surgery.   ____  4. Bring all medications with you on the day of surgery if instructed.    __x__ 5. Notify your doctor if there is any change in your medical condition     (cold, fever, infections).     Do not wear jewelry, make-up, hairpins, clips or nail polish.  Do not wear lotions, powders, or perfumes. You may wear deodorant.  Do not shave 48 hours prior to surgery. Men may shave face and neck.  Do not bring valuables to the hospital.    [facility] is not responsible for any belongings or valuables.               Contacts, dentures or bridgework may not be worn into surgery.  Leave your suitcase in the car. After surgery it may be brought to your room.  For patients admitted to the hospital, discharge time is determined by your treatment team.   Patients discharged  the day of surgery will not be allowed to drive home.  You will need someone to drive you home and stay with you the night of your procedure.    Please read over the following fact sheets that you were given:   [facility] Preparing for Surgery and or MRSA Information   ____ Take anti-hypertensive listed below, cardiac, seizure, asthma,anti-reflux and psychiatric medicines. These include:  1. NONE  2.  3.  4.  5.  6.  ____Fleets enema or Magnesium Citrate as directed.   _x___ Use CHG Soap or sage wipes as directed on instruction sheet   ____ Use inhalers on the day of surgery and bring to hospital day of surgery  ____ Stop Metformin and Janumet 2 days prior to surgery.    ____ Take 1/2 of usual insulin dose the night before surgery and none on the morning surgery.   ____ Follow recommendations from Cardiologist, Pulmonologist or PCP regarding stopping Aspirin, Coumadin, Plavix ,Eliquis, Effient, or Pradaxa, and Pletal.  ____Stop Anti-inflammatories such as Advil, Aleve, Ibuprofen, Motrin, Naproxen, Naprosyn, Goodies powders or aspirin products. OK to take Tylenol   ____ Stop supplements until after surgery.     ____ Bring C-Pap to the hospital.

## 2017-03-18 ENCOUNTER — Encounter: Payer: Self-pay | Admitting: *Deleted

## 2017-03-19 ENCOUNTER — Encounter
Admission: RE | Admit: 2017-03-19 | Discharge: 2017-03-19 | Disposition: A | Discharge status: Home / Self Care | Payer: Self-pay | Source: Ambulatory Visit | Attending: General Surgery | Admitting: General Surgery

## 2017-03-19 DIAGNOSIS — Z01812 Encounter for preprocedural laboratory examination: Secondary | ICD-10-CM | POA: Insufficient documentation

## 2017-03-19 LAB — HEMOGLOBIN: Hemoglobin: 13.1 g/dL (ref 12.0–16.0)

## 2017-03-19 LAB — POTASSIUM: POTASSIUM: 3.8 mmol/L (ref 3.5–5.1)

## 2017-03-21 MED ORDER — CEFAZOLIN SODIUM-DEXTROSE 2-4 GM/100ML-% IV SOLN
2.0000 g | INTRAVENOUS | Status: AC
  Administered 2017-03-22: 2 g via INTRAVENOUS

## 2017-03-22 ENCOUNTER — Ambulatory Visit
Admission: RE | Admit: 2017-03-22 | Discharge: 2017-03-22 | Disposition: A | Discharge status: Home / Self Care | Payer: Self-pay | Source: Ambulatory Visit | Attending: General Surgery | Admitting: General Surgery

## 2017-03-22 ENCOUNTER — Encounter: Payer: Self-pay | Admitting: Emergency Medicine

## 2017-03-22 ENCOUNTER — Encounter
Admission: RE | Disposition: A | Discharge status: Home / Self Care | Payer: Self-pay | Source: Ambulatory Visit | Attending: General Surgery

## 2017-03-22 ENCOUNTER — Ambulatory Visit: Payer: Self-pay | Admitting: Certified Registered Nurse Anesthetist

## 2017-03-22 DIAGNOSIS — D242 Benign neoplasm of left breast: Secondary | ICD-10-CM | POA: Insufficient documentation

## 2017-03-22 DIAGNOSIS — D493 Neoplasm of unspecified behavior of breast: Secondary | ICD-10-CM

## 2017-03-22 DIAGNOSIS — N6452 Nipple discharge: Secondary | ICD-10-CM

## 2017-03-22 DIAGNOSIS — Z833 Family history of diabetes mellitus: Secondary | ICD-10-CM | POA: Insufficient documentation

## 2017-03-22 DIAGNOSIS — Z803 Family history of malignant neoplasm of breast: Secondary | ICD-10-CM | POA: Insufficient documentation

## 2017-03-22 DIAGNOSIS — F329 Major depressive disorder, single episode, unspecified: Secondary | ICD-10-CM | POA: Insufficient documentation

## 2017-03-22 DIAGNOSIS — Z8249 Family history of ischemic heart disease and other diseases of the circulatory system: Secondary | ICD-10-CM | POA: Insufficient documentation

## 2017-03-22 DIAGNOSIS — F419 Anxiety disorder, unspecified: Secondary | ICD-10-CM | POA: Insufficient documentation

## 2017-03-22 DIAGNOSIS — Z885 Allergy status to narcotic agent status: Secondary | ICD-10-CM | POA: Insufficient documentation

## 2017-03-22 DIAGNOSIS — N6012 Diffuse cystic mastopathy of left breast: Secondary | ICD-10-CM | POA: Insufficient documentation

## 2017-03-22 DIAGNOSIS — Z87891 Personal history of nicotine dependence: Secondary | ICD-10-CM | POA: Insufficient documentation

## 2017-03-22 DIAGNOSIS — E876 Hypokalemia: Secondary | ICD-10-CM | POA: Insufficient documentation

## 2017-03-22 DIAGNOSIS — K5090 Crohn's disease, unspecified, without complications: Secondary | ICD-10-CM | POA: Insufficient documentation

## 2017-03-22 DIAGNOSIS — R51 Headache: Secondary | ICD-10-CM | POA: Insufficient documentation

## 2017-03-22 HISTORY — PX: BREAST BIOPSY: SHX20

## 2017-03-22 LAB — POCT PREGNANCY, URINE: Preg Test, Ur: NEGATIVE

## 2017-03-22 SURGERY — BREAST BIOPSY
Anesthesia: General | Laterality: Left | Wound class: Clean Contaminated

## 2017-03-22 MED ORDER — ONDANSETRON HCL 4 MG/2ML IJ SOLN
INTRAMUSCULAR | Status: AC
  Filled 2017-03-22: qty 2

## 2017-03-22 MED ORDER — ONDANSETRON HCL 4 MG/2ML IJ SOLN: 4.0000 mg | Freq: Once | INTRAMUSCULAR | Status: DC | PRN

## 2017-03-22 MED ORDER — CEFAZOLIN SODIUM-DEXTROSE 2-4 GM/100ML-% IV SOLN
INTRAVENOUS | Status: AC
  Filled 2017-03-22: qty 100

## 2017-03-22 MED ORDER — FAMOTIDINE 20 MG PO TABS
ORAL_TABLET | ORAL | Status: AC
  Filled 2017-03-22: qty 1

## 2017-03-22 MED ORDER — HYDROCODONE-ACETAMINOPHEN 5-325 MG PO TABS
ORAL_TABLET | ORAL | Status: AC
  Filled 2017-03-22: qty 1

## 2017-03-22 MED ORDER — SEVOFLURANE IN SOLN
RESPIRATORY_TRACT | Status: AC
  Filled 2017-03-22: qty 250

## 2017-03-22 MED ORDER — DEXAMETHASONE SODIUM PHOSPHATE 10 MG/ML IJ SOLN
INTRAMUSCULAR | Status: DC | PRN
  Administered 2017-03-22: 5 mg via INTRAVENOUS

## 2017-03-22 MED ORDER — KETOROLAC TROMETHAMINE 30 MG/ML IJ SOLN
INTRAMUSCULAR | Status: DC | PRN
  Administered 2017-03-22: 30 mg via INTRAVENOUS

## 2017-03-22 MED ORDER — FENTANYL CITRATE (PF) 100 MCG/2ML IJ SOLN
INTRAMUSCULAR | Status: AC
Start: 2017-03-22 — End: 2017-03-22
  Administered 2017-03-22: 25 ug via INTRAVENOUS
  Filled 2017-03-22: qty 2

## 2017-03-22 MED ORDER — FAMOTIDINE 20 MG PO TABS
20.0000 mg | ORAL_TABLET | Freq: Once | ORAL | Status: AC
  Administered 2017-03-22: 20 mg via ORAL

## 2017-03-22 MED ORDER — FENTANYL CITRATE (PF) 100 MCG/2ML IJ SOLN
25.0000 ug | INTRAMUSCULAR | Status: AC | PRN
  Administered 2017-03-22 (×6): 25 ug via INTRAVENOUS

## 2017-03-22 MED ORDER — HYDROCODONE-ACETAMINOPHEN 5-325 MG PO TABS
1.0000 | ORAL_TABLET | ORAL | Status: AC | PRN
  Administered 2017-03-22: 1 via ORAL

## 2017-03-22 MED ORDER — ACETAMINOPHEN 10 MG/ML IV SOLN
INTRAVENOUS | Status: AC
  Filled 2017-03-22: qty 100

## 2017-03-22 MED ORDER — LACTATED RINGERS IV SOLN
INTRAVENOUS | Status: DC
  Administered 2017-03-22 (×2): via INTRAVENOUS

## 2017-03-22 MED ORDER — LIDOCAINE HCL (CARDIAC) 20 MG/ML IV SOLN
INTRAVENOUS | Status: DC | PRN
  Administered 2017-03-22: 100 mg via INTRAVENOUS

## 2017-03-22 MED ORDER — BUPIVACAINE-EPINEPHRINE (PF) 0.5% -1:200000 IJ SOLN
INTRAMUSCULAR | Status: DC | PRN
  Administered 2017-03-22: 20 mL via PERINEURAL
  Administered 2017-03-22: 10 mL via PERINEURAL

## 2017-03-22 MED ORDER — PROPOFOL 10 MG/ML IV BOLUS
INTRAVENOUS | Status: DC | PRN
  Administered 2017-03-22: 200 mg via INTRAVENOUS

## 2017-03-22 MED ORDER — HYDROCODONE-ACETAMINOPHEN 5-325 MG PO TABS: 1.0000 | ORAL_TABLET | ORAL | 0 refills | Status: DC | PRN

## 2017-03-22 MED ORDER — ONDANSETRON HCL 4 MG/2ML IJ SOLN
INTRAMUSCULAR | Status: DC | PRN
  Administered 2017-03-22: 4 mg via INTRAVENOUS

## 2017-03-22 MED ORDER — FENTANYL CITRATE (PF) 100 MCG/2ML IJ SOLN
INTRAMUSCULAR | Status: AC
  Filled 2017-03-22: qty 2

## 2017-03-22 MED ORDER — DEXAMETHASONE SODIUM PHOSPHATE 10 MG/ML IJ SOLN
INTRAMUSCULAR | Status: AC
  Filled 2017-03-22: qty 1

## 2017-03-22 MED ORDER — BUPIVACAINE-EPINEPHRINE (PF) 0.5% -1:200000 IJ SOLN
INTRAMUSCULAR | Status: AC
  Filled 2017-03-22: qty 30

## 2017-03-22 MED ORDER — ACETAMINOPHEN 10 MG/ML IV SOLN
INTRAVENOUS | Status: DC | PRN
  Administered 2017-03-22: 1000 mg via INTRAVENOUS

## 2017-03-22 MED ORDER — LIDOCAINE HCL (PF) 2 % IJ SOLN
INTRAMUSCULAR | Status: AC
  Filled 2017-03-22: qty 10

## 2017-03-22 MED ORDER — MIDAZOLAM HCL 2 MG/2ML IJ SOLN
INTRAMUSCULAR | Status: AC
  Filled 2017-03-22: qty 2

## 2017-03-22 MED ORDER — MIDAZOLAM HCL 2 MG/2ML IJ SOLN
INTRAMUSCULAR | Status: DC | PRN
  Administered 2017-03-22: 2 mg via INTRAVENOUS

## 2017-03-22 MED ORDER — FENTANYL CITRATE (PF) 100 MCG/2ML IJ SOLN
INTRAMUSCULAR | Status: AC
  Administered 2017-03-22: 25 ug via INTRAVENOUS
  Filled 2017-03-22: qty 2

## 2017-03-22 MED ORDER — FENTANYL CITRATE (PF) 100 MCG/2ML IJ SOLN
INTRAMUSCULAR | Status: DC | PRN
  Administered 2017-03-22: 50 ug via INTRAVENOUS
  Administered 2017-03-22 (×2): 25 ug via INTRAVENOUS

## 2017-03-22 SURGICAL SUPPLY — 44 items
BLADE SURG 15 STRL SS SAFETY (BLADE) ×4 IMPLANT
BNDG GAUZE 4.5X4.1 6PLY STRL (MISCELLANEOUS) ×1 IMPLANT
BRA SURG CMPR BEIGE SM SILKY (MISCELLANEOUS) ×1
BRA SURG PR BEIGE SM SILKY (MISCELLANEOUS) ×1 IMPLANT
CANISTER SUCT 1200ML W/VALVE (MISCELLANEOUS) ×3 IMPLANT
CHLORAPREP W/TINT 26ML (MISCELLANEOUS) ×3 IMPLANT
CLOSURE WOUND 1/2 X4 (GAUZE/BANDAGES/DRESSINGS) ×1
CNTNR SPEC 2.5X3XGRAD LEK (MISCELLANEOUS) ×1
CONT SPEC 4OZ STER OR WHT (MISCELLANEOUS) ×2
CONT SPEC 4OZ STRL OR WHT (MISCELLANEOUS) ×1
CONTAINER SPEC 2.5X3XGRAD LEK (MISCELLANEOUS) ×1 IMPLANT
COVER PROBE FLX POLY STRL (MISCELLANEOUS) ×1 IMPLANT
DEVICE DUBIN SPECIMEN MAMMOGRA (MISCELLANEOUS) ×1 IMPLANT
DRAPE LAPAROTOMY 100X77 ABD (DRAPES) ×3 IMPLANT
DRSG TELFA 3X8 NADH (GAUZE/BANDAGES/DRESSINGS) ×6 IMPLANT
DRSG TELFA 4X3 1S NADH ST (GAUZE/BANDAGES/DRESSINGS) ×1 IMPLANT
ELECT CAUTERY BLADE TIP 2.5 (TIP) ×3
ELECT REM PT RETURN 9FT ADLT (ELECTROSURGICAL) ×3
ELECTRODE CAUTERY BLDE TIP 2.5 (TIP) ×1 IMPLANT
ELECTRODE REM PT RTRN 9FT ADLT (ELECTROSURGICAL) ×1 IMPLANT
GAUZE FLUFF 18X24 1PLY STRL (GAUZE/BANDAGES/DRESSINGS) ×3 IMPLANT
GLOVE BIO SURGEON STRL SZ7.5 (GLOVE) ×7 IMPLANT
GLOVE INDICATOR 8.0 STRL GRN (GLOVE) ×7 IMPLANT
GOWN STRL REUS W/ TWL LRG LVL3 (GOWN DISPOSABLE) ×2 IMPLANT
GOWN STRL REUS W/TWL LRG LVL3 (GOWN DISPOSABLE) ×9
KIT RM TURNOVER STRD PROC AR (KITS) ×3 IMPLANT
LABEL OR SOLS (LABEL) ×3 IMPLANT
MARGIN MAP 10MM (MISCELLANEOUS) ×3 IMPLANT
NDL HYPO 25X1 1.5 SAFETY (NEEDLE) ×1 IMPLANT
NEEDLE HYPO 22GX1.5 SAFETY (NEEDLE) ×3 IMPLANT
NEEDLE HYPO 25X1 1.5 SAFETY (NEEDLE) IMPLANT
PACK BASIN MINOR ARMC (MISCELLANEOUS) ×3 IMPLANT
PAD DRESSING TELFA 3X8 NADH (GAUZE/BANDAGES/DRESSINGS) IMPLANT
SHEARS HARMONIC 9CM CVD (BLADE) ×1 IMPLANT
STRIP CLOSURE SKIN 1/2X4 (GAUZE/BANDAGES/DRESSINGS) ×2 IMPLANT
SUT ETHILON 3-0 FS-10 30 BLK (SUTURE) ×3
SUT VIC AB 2-0 CT1 27 (SUTURE) ×6
SUT VIC AB 2-0 CT1 TAPERPNT 27 (SUTURE) ×2 IMPLANT
SUT VIC AB 4-0 FS2 27 (SUTURE) ×3 IMPLANT
SUTURE EHLN 3-0 FS-10 30 BLK (SUTURE) ×1 IMPLANT
SWABSTK COMLB BENZOIN TINCTURE (MISCELLANEOUS) ×3 IMPLANT
SYR CONTROL 10ML (SYRINGE) ×3 IMPLANT
TAPE TRANSPORE STRL 2 31045 (GAUZE/BANDAGES/DRESSINGS) ×3 IMPLANT
WATER STERILE IRR 1000ML POUR (IV SOLUTION) ×3 IMPLANT

## 2017-03-22 NOTE — Progress Notes (Signed)
Dr. Byrnett into see 

## 2017-03-22 NOTE — Anesthesia Preprocedure Evaluation (Signed)
Anesthesia Evaluation  Patient identified by MRN, date of birth, ID band Patient awake    Reviewed: Allergy & Precautions, NPO status , Patient's Chart, lab work & pertinent test results  History of Anesthesia Complications (+) Family history of anesthesia reactionNegative for: history of anesthetic complications  Airway Mallampati: II  TM Distance: >3 FB     Dental  (+) Teeth Intact   Pulmonary pneumonia, resolved, Current Smoker, former smoker,    Pulmonary exam normal        Cardiovascular negative cardio ROS Normal cardiovascular exam     Neuro/Psych  Headaches, negative neurological ROS  negative psych ROS   GI/Hepatic negative GI ROS, Neg liver ROS,   Endo/Other  negative endocrine ROS  Renal/GU negative Renal ROS  negative genitourinary   Musculoskeletal negative musculoskeletal ROS (+)   Abdominal Normal abdominal exam  (+)   Peds negative pediatric ROS (+)  Hematology negative hematology ROS (+) anemia ,   Anesthesia Other Findings   Reproductive/Obstetrics                             Anesthesia Physical  Anesthesia Plan  ASA: II and emergent  Anesthesia Plan: General   Post-op Pain Management:    Induction: Intravenous  PONV Risk Score and Plan:   Airway Management Planned:   Additional Equipment:   Intra-op Plan:   Post-operative Plan: Extubation in OR  Informed Consent: I have reviewed the patients History and Physical, chart, labs and discussed the procedure including the risks, benefits and alternatives for the proposed anesthesia with the patient or authorized representative who has indicated his/her understanding and acceptance.     Plan Discussed with: CRNA and Surgeon  Anesthesia Plan Comments:         Anesthesia Quick Evaluation

## 2017-03-22 NOTE — Op Note (Signed)
Preoperative diagnosis: Chronic left nipple drainage, possible papilloma.  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  Operative procedure: Excision of retroareolar ductal structures.  Operating Surgeon: Hervey Ard, MD.  Anesthesia: General by LMA, Marcaine 0.5% with 1-200,000 units of epinephrine, 30 cc.  Estimated blood loss: Less than 5 cc.  Clinical note: This 32 year old smoker has had persistent nipple drainage that is unresponsive to conservative therapy.  She was admitted for excision of the retroareolar structures.  Operative note: With the patient under adequate general anesthesia the breast was prepped with ChloraPrep and draped.  Local anesthetic was infiltrated well away from the nipple areolar complex for postoperative analgesia.  A circumareolar incision from the 1 to 5 o'clock position encompassing the nodular area at the 2-3 o'clock position in the tissue between the base of the nipple and the edge of the areola was made and carried down through skin subtendinous tissue with hemostasis achieved with electrocautery.  The areolar skin was elevated off the underlying ductal tissue and this was transected at the base of the nipple with cautery.  A 2 x 2 x 3 cm block of tissue was excised orientated and sent for routine histology.  The tissue had a consistently of chronically inflamed breast parenchyma.  The remaining tissue appeared soft.  A few dilated ducts with cheesy material were noted.  A pursestring suture of 2-0 Vicryl was used to bring the breast parenchyma together into the retroareolar space.  Interrupted 2-0 Vicryl sutures were used for apposition of the subcutaneous fat.  The skin was closed with interrupted 4-0 Vicryl subcuticular sutures.  Benzoin, Steri-Strips, Telfa pad to bolster the nipple and a compressive dressing with fluff gauze and a surgical bra was applied.  A single Steri-Strip was placed in the 300 degree fashion around  the help provide support.  The patient tolerated the procedure well and was taken to recovery room in stable condition.

## 2017-03-22 NOTE — Anesthesia Postprocedure Evaluation (Signed)
Anesthesia Post Note  Patient: Mary Maddox  Procedure(s) Performed: BREAST BIOPSY (Left )  Patient location during evaluation: PACU Anesthesia Type: General Level of consciousness: awake and alert and oriented Pain management: pain level controlled Vital Signs Assessment: post-procedure vital signs reviewed and stable Respiratory status: spontaneous breathing Cardiovascular status: blood pressure returned to baseline Anesthetic complications: no     Last Vitals:  Vitals:   03/22/17 1254 03/22/17 1319  BP: 116/71 (!) 131/91  Pulse: 61 73  Resp: 16   Temp: 36.8 C   SpO2: 99% 100%    Last Pain:  Vitals:   03/22/17 1319  TempSrc:   PainSc: 4                  Danyal Whitenack

## 2017-03-22 NOTE — Transfer of Care (Signed)
Immediate Anesthesia Transfer of Care Note  Patient: Mary Maddox  Procedure(s) Performed: BREAST BIOPSY (Left )  Patient Location: PACU  Anesthesia Type:General  Level of Consciousness: awake, alert  and oriented  Airway & Oxygen Therapy: Patient Spontanous Breathing and Patient connected to face mask oxygen  Post-op Assessment: Report given to RN and Post -op Vital signs reviewed and stable  Post vital signs: Reviewed and stable  Last Vitals:  Vitals:   03/22/17 0925 03/22/17 1139  BP: 126/90 (!) 138/98  Pulse: 72 88  Resp: 17 16  Temp:  36.6 C  SpO2: 100% 100%    Last Pain:  Vitals:   03/22/17 0925  TempSrc:   PainSc: 6          Complications: No apparent anesthesia complications

## 2017-03-22 NOTE — Anesthesia Post-op Follow-up Note (Signed)
Anesthesia QCDR form completed.        

## 2017-03-22 NOTE — Anesthesia Procedure Notes (Signed)
Procedure Name: LMA Insertion Date/Time: 03/22/2017 10:48 AM Performed by: Lowry Bowl, CRNA Pre-anesthesia Checklist: Patient identified, Emergency Drugs available, Patient being monitored and Suction available Patient Re-evaluated:Patient Re-evaluated prior to induction Oxygen Delivery Method: Circle system utilized Preoxygenation: Pre-oxygenation with 100% oxygen Induction Type: IV induction Ventilation: Mask ventilation without difficulty LMA: LMA inserted LMA Size: 4.0 Number of attempts: 1 Placement Confirmation: breath sounds checked- equal and bilateral and positive ETCO2 Tube secured with: Tape Dental Injury: Teeth and Oropharynx as per pre-operative assessment

## 2017-03-22 NOTE — H&P (Signed)
No change in clinical history or exam. For excision left breast ductal tissue.

## 2017-03-25 ENCOUNTER — Telehealth: Payer: Self-pay | Admitting: General Surgery

## 2017-03-25 LAB — SURGICAL PATHOLOGY

## 2017-03-25 NOTE — Telephone Encounter (Signed)
Notified pathology was fine. Reports rash, likely from [place]. Benadryl for the rash, OTC meds for pain. F/U as planned.

## 2017-03-30 ENCOUNTER — Ambulatory Visit (INDEPENDENT_AMBULATORY_CARE_PROVIDER_SITE_OTHER): Payer: Self-pay | Admitting: General Surgery

## 2017-03-30 ENCOUNTER — Encounter: Payer: Self-pay | Admitting: General Surgery

## 2017-03-30 VITALS — BP 124/82 | HR 86 | Resp 12 | Ht 67.0 in | Wt 155.0 lb

## 2017-03-30 DIAGNOSIS — D242 Benign neoplasm of left breast: Secondary | ICD-10-CM

## 2017-03-30 NOTE — Progress Notes (Signed)
Patient ID: Mary Maddox, female   DOB: 10/02/1985, 32 y.o.   MRN: [id number]  Chief Complaint  Patient presents with  . Routine Post Op    HPI Mary Maddox is a 32 y.o. female here today for her post op left breast biopsy done on 03/22/2017. Patient states she is doing well.  HPI  Past Medical History:  Diagnosis Date  . Anemia   . Anxiety   . 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 Crohn's disease in remission [facility])    as child  . Depression   . Elevated blood pressure reading    PT STATES SHE HAS BEEN HAVING HEADACHES AND HER BP MACHINE AT HOME IS READING HIGH- AT DR BYRNETTS OFFICE ON 03-11-17 BP WAS 148/88-WHEN PT COMES TO PAT ON 03-22-17 WILL RECHECK BP THEN  . Family history of adverse reaction to anesthesia    UNSURE OF MOM'S AND SISTER-HARD TO GET HER UNDER AND HARD TO WAKE UP  . Headache    MIGRAINES  . Hypokalemia   . Pneumonia 2014    Past Surgical History:  Procedure Laterality Date  . BREAST BIOPSY Left 03/22/2017   Procedure: BREAST BIOPSY;  Surgeon: Robert Bellow, MD;  Location: [facility] ORS;  Service: General;  Laterality: Left;  . LEEP  2013   X3  . TUBAL LIGATION Bilateral 09/01/2015   Procedure: POST PARTUM TUBAL LIGATION;  Surgeon: Will Bonnet, MD;  Location: [facility] ORS;  Service: Gynecology;  Laterality: Bilateral;  . UMBILICAL HERNIA REPAIR      Family History  Problem Relation Age of Onset  . Hypertension Unknown   . Diabetes Unknown   . Breast cancer Paternal Grandmother   . Colon cancer Neg Hx     Social History Social History   Tobacco Use  . Smoking status: Former Smoker    Packs/day: 0.25    Years: 10.00    Pack years: 2.50    Types: Cigarettes    Last attempt to quit: 03/01/2017    Years since quitting: 0.0  . Smokeless tobacco: Never Used  Substance Use Topics  . Alcohol use: Yes    Comment: OCC WINE  . Drug use: No    Comment: adderall    Allergies  Allergen Reactions  . Darvon [Propoxyphene] Hives     Current Outpatient Medications  Medication Sig Dispense Refill  . ibuprofen (ADVIL,MOTRIN) 200 MG tablet Take 400 mg by mouth every 4 (four) hours as needed for headache or moderate pain.      No current facility-administered medications for this visit.     Review of Systems Review of Systems  Constitutional: Negative.   Respiratory: Negative.   Cardiovascular: Negative.     Blood pressure 124/82, pulse 86, resp. rate 12, height 5\' 7"  (1.702 m), weight 155 lb (70.3 kg), last menstrual period 03/21/2017, not currently breastfeeding.  Physical Exam Physical Exam  Constitutional: She is oriented to person, place, and time. She appears well-developed and well-nourished.  Pulmonary/Chest:    Left breast incision is clean and healing well.   Neurological: She is alert and oriented to person, place, and time.  Skin: Skin is warm and dry.    Data Reviewed A. BREAST TISSUE, LEFT RETROAREOLAR; EXCISION:  - FIBROADENOMA, UP TO 1.3 CM.  - BACKGROUND FIBROCYSTIC CHANGE AND NODULAR ADENOSIS.  - SMALL INTRADUCTAL PAPILLOMA, 0.1 CM.  - NEGATIVE FOR ATYPIA AND MALIGNANCY.   Assessment    Doing well post biopsy.  Plan      The patient is aware to use a heating pad as needed for comfort.Wear bra day and night. Patient to return in one month. The patient is aware to call back for any questions or concerns.  The Steri-Strips were left intact as they were firmly adherent.  She was advised that these can be removed when they loosen in the next week or so.  HPI, Physical Exam, Assessment and Plan have been scribed under the direction and in the presence of Hervey Ard, MD.  Gaspar Cola, CMA  I have completed the exam and reviewed the above documentation for accuracy and completeness.  I agree with the above.  Haematologist has been used and any errors in dictation or transcription are unintentional.  Hervey Ard, M.D., F.A.C.S.   Mary Maddox 03/30/2017,  11:30 AM

## 2017-03-30 NOTE — Patient Instructions (Signed)
   The patient is aware to use a heating pad as needed for comfort.Wear bra day and night. Patient to return in one month. The patient is aware to call back for any questions or concerns.

## 2017-03-31 ENCOUNTER — Encounter: Payer: Self-pay | Admitting: General Surgery

## 2017-03-31 ENCOUNTER — Telehealth: Payer: Self-pay | Admitting: *Deleted

## 2017-03-31 NOTE — Telephone Encounter (Signed)
Patient just called in and is sending a picture into my chart. She states she just went to the restroom and took the dressing off and the area has opened up.

## 2017-03-31 NOTE — Telephone Encounter (Signed)
Reviewed incisional care, antibiotic ointment to the area and gauze if needed, keep area clean. May use heat or ice as needed for comfort and tylenol or Advil of choice for discomfort, pt agrees. Follow up as scheduled, aware to call if she has new concerns to questions.

## 2017-03-31 NOTE — Telephone Encounter (Signed)
Patient called in this morning and states she woke up this morning with her right nipple on fire. She states the area is not red but she is in a lot of pain. No fever. She did use the heating pad off and on and wearing her bra day and night. She was seen yesterday(03/30/2017) in office.

## 2017-04-15 ENCOUNTER — Telehealth: Payer: Self-pay | Admitting: *Deleted

## 2017-04-15 NOTE — Telephone Encounter (Signed)
She called to let us know that her left nipple incision has opened up. She states it seemed to be doing well up until yesterday. Denies any injury or trauma. She states the drainage is yellowish in color and thick, the surrounding area is a little red. She has had chills since Saturday but not fever, nausea or vomiting. She was wanting to know if she needed to go to the ED or Urgent Care. She is in [place] with her job, advised to start with Urgent Care if she desires. Advised to use a warm compresses as well. She will send [place] a MyChart message with her status update and what they tell her. Her follow up with Dr Bary Castilla is 04-27-17, as long as she can get home for [place]. Appreciates call. I told her I would let Dr Bary Castilla know.

## 2017-04-16 ENCOUNTER — Telehealth: Payer: Self-pay

## 2017-04-16 MED ORDER — SULFAMETHOXAZOLE-TRIMETHOPRIM 800-160 MG PO TABS: 1.0000 | ORAL_TABLET | Freq: Two times a day (BID) | ORAL | 0 refills | Status: AC

## 2017-04-16 NOTE — Telephone Encounter (Signed)
Patient called back with concerns about her breast. She states that it is still draining, but now having some redness and swelling. She would like to avoid the Urgent care if possible and was wondering if she could have antibiotics called in as [place]. She has some chills but no fever. I let her know I would speak with Dr Bary Castilla about this and see what he thought was best.  Notified Dr Bary Castilla and he ordered Bactrim DS one twice daily for 10 days. She will follow up as scheduled and will send [place] a message on Monday to let us know how she is doing. She is aware she may call with any questions or concerns. She is amendable to this and will start the antibiotics.

## 2017-04-27 ENCOUNTER — Ambulatory Visit: Payer: Self-pay | Admitting: General Surgery

## 2017-06-03 ENCOUNTER — Ambulatory Visit: Payer: Self-pay | Admitting: General Surgery

## 2017-06-16 ENCOUNTER — Encounter: Payer: Self-pay | Admitting: *Deleted

## 2017-06-23 ENCOUNTER — Emergency Department: Payer: Self-pay

## 2017-06-23 ENCOUNTER — Other Ambulatory Visit: Payer: Self-pay

## 2017-06-23 ENCOUNTER — Encounter: Payer: Self-pay | Admitting: Emergency Medicine

## 2017-06-23 ENCOUNTER — Emergency Department
Admission: EM | Admit: 2017-06-23 | Discharge: 2017-06-23 | Disposition: A | Discharge status: Home / Self Care | Payer: Self-pay | Attending: Emergency Medicine | Admitting: Emergency Medicine

## 2017-06-23 DIAGNOSIS — Y998 Other external cause status: Secondary | ICD-10-CM | POA: Insufficient documentation

## 2017-06-23 DIAGNOSIS — F329 Major depressive disorder, single episode, unspecified: Secondary | ICD-10-CM | POA: Insufficient documentation

## 2017-06-23 DIAGNOSIS — W231XXA Caught, crushed, jammed, or pinched between stationary objects, initial encounter: Secondary | ICD-10-CM | POA: Insufficient documentation

## 2017-06-23 DIAGNOSIS — Z87891 Personal history of nicotine dependence: Secondary | ICD-10-CM | POA: Insufficient documentation

## 2017-06-23 DIAGNOSIS — Z8541 Personal history of malignant neoplasm of cervix uteri: Secondary | ICD-10-CM | POA: Insufficient documentation

## 2017-06-23 DIAGNOSIS — Y9289 Other specified places as the place of occurrence of the external cause: Secondary | ICD-10-CM | POA: Insufficient documentation

## 2017-06-23 DIAGNOSIS — F419 Anxiety disorder, unspecified: Secondary | ICD-10-CM | POA: Insufficient documentation

## 2017-06-23 DIAGNOSIS — S62667A Nondisplaced fracture of distal phalanx of left little finger, initial encounter for closed fracture: Secondary | ICD-10-CM | POA: Insufficient documentation

## 2017-06-23 DIAGNOSIS — Y9389 Activity, other specified: Secondary | ICD-10-CM | POA: Insufficient documentation

## 2017-06-23 MED ORDER — NAPROXEN 500 MG PO TABS: 500.0000 mg | ORAL_TABLET | Freq: Two times a day (BID) | ORAL | 0 refills | Status: AC

## 2017-06-23 MED ORDER — TRAMADOL HCL 50 MG PO TABS: 50.0000 mg | ORAL_TABLET | Freq: Four times a day (QID) | ORAL | 0 refills | Status: DC | PRN

## 2017-06-23 NOTE — ED Triage Notes (Signed)
Pt was jumping on bounce house Saturday and fell, injury to left 5th finger.

## 2017-06-24 NOTE — ED Provider Notes (Signed)
[facility] Emergency Department Provider Note ____________________________________________  Time seen: Approximately 12:01 AM  I have reviewed the triage vital signs and the nursing notes.   HISTORY  Chief Complaint Finger Injury    HPI Khrystyne Arpin is a 32 y.o. female who presents to the emergency department for evaluation and treatment of left small finger pain.  She states that she was jumping in a bounce house and her finger caught in the netting.  Incident happened 4 days ago while at her best friend's wedding.  She states that she has just been trying to deal with the pain, but today at work she just could not work any longer.  She has not taken any medications.  She states that the finger has continued to swell every day.  Past Medical History:  Diagnosis Date  . Anemia   . Anxiety   . 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 Crohn's disease in remission [facility])    as child  . Depression   . Elevated blood pressure reading    PT STATES SHE HAS BEEN HAVING HEADACHES AND HER BP MACHINE AT HOME IS READING HIGH- AT DR BYRNETTS OFFICE ON 03-11-17 BP WAS 148/88-WHEN PT COMES TO PAT ON 03-22-17 WILL RECHECK BP THEN  . Family history of adverse reaction to anesthesia    UNSURE OF MOM'S AND SISTER-HARD TO GET HER UNDER AND HARD TO WAKE UP  . Headache    MIGRAINES  . Hypokalemia   . Pneumonia 2014    Patient Active Problem List   Diagnosis Date Noted  . Pain of left breast 01/25/2017  . Discharge from left nipple 12/23/2016  . Mass of upper outer quadrant of left breast 12/23/2016    Past Surgical History:  Procedure Laterality Date  . BREAST BIOPSY Left 03/22/2017   Procedure: BREAST BIOPSY;  Surgeon: Robert Bellow, MD;  Location: [facility] ORS;  Service: General;  Laterality: Left;  . LEEP  2013   X3  . TUBAL LIGATION Bilateral 09/01/2015   Procedure: POST PARTUM TUBAL LIGATION;  Surgeon: Will Bonnet, MD;  Location: [facility] ORS;  Service:  Gynecology;  Laterality: Bilateral;  . UMBILICAL HERNIA REPAIR      Prior to Admission medications   Medication Sig Start Date End Date Taking? Authorizing Provider  ibuprofen (ADVIL,MOTRIN) 200 MG tablet Take 400 mg by mouth every 4 (four) hours as needed for headache or moderate pain.     [provider]  naproxen (NAPROSYN) 500 MG tablet Take 1 tablet (500 mg total) by mouth 2 (two) times daily with a meal. 06/23/17   Vanderbilt Ranieri B, FNP  traMADol (ULTRAM) 50 MG tablet Take 1 tablet (50 mg total) by mouth every 6 (six) hours as needed. 06/23/17   Victorino Dike, FNP    Allergies Darvon [propoxyphene]  Family History  Problem Relation Age of Onset  . Hypertension Unknown   . Diabetes Unknown   . Breast cancer Paternal Grandmother   . Colon cancer Neg Hx     Social History Social History   Tobacco Use  . Smoking status: Former Smoker    Packs/day: 0.25    Years: 10.00    Pack years: 2.50    Types: Cigarettes    Last attempt to quit: 03/01/2017    Years since quitting: 0.3  . Smokeless tobacco: Never Used  Substance Use Topics  . Alcohol use: Yes    Comment: OCC WINE  . Drug use: No  Comment: adderall    Review of Systems Constitutional: Negative for fever. Cardiovascular: Negative for chest pain. Respiratory: Negative for shortness of breath. Musculoskeletal: Positive for left hand pain. Skin: Positive for left fifth finger swelling. Neurological: Negative for decrease in sensation  ____________________________________________   PHYSICAL EXAM:  VITAL SIGNS: ED Triage Vitals [06/23/17 1942]  Enc Vitals Group     BP 132/86     Pulse Rate 98     Resp 18     Temp 98.2 F (36.8 C)     Temp Source Oral     SpO2 100 %     Weight 150 lb (68 kg)     Height 5\' 6"  (1.676 m)     Head Circumference      Peak Flow      Pain Score 6     Pain Loc      Pain Edu?      Excl. in [place]?     Constitutional: Alert and oriented. Well appearing and in no  acute distress. Eyes: Conjunctivae are clear without discharge or drainage Head: Atraumatic Neck: Supple.  No focal midline tenderness on exam. Respiratory: No cough. Respirations are even and unlabored. Musculoskeletal: Left hand, fifth digit with swelling over the PIP and DIP.  Range of motion is possible but painful. Neurologic: Motor and sensory function, specifically of the left hand, fifth digit is intact. Skin: Intact Psychiatric: Affect and behavior are appropriate.  ____________________________________________   LABS (all labs ordered are listed, but only abnormal results are displayed)  Labs Reviewed - No data to display ____________________________________________  RADIOLOGY  Nondisplaced mid shaft fracture of the distal phalanx of the left little finger. ____________________________________________   PROCEDURES  Procedures  ____________________________________________   INITIAL IMPRESSION / ASSESSMENT AND PLAN / ED COURSE  Martika Egler is a 32 y.o. who presents to the emergency department for treatment and evaluation of the left fifth finger pain after injury on Saturday.  She was placed in a aluminum foam splint which was then buddy taped to the ring finger.   Patient instructed to follow-up with orthopedics .  She was also instructed to return to the emergency department for symptoms that change or worsen if unable schedule an appointment with orthopedics or primary care.  Medications - No data to display  Pertinent labs & imaging results that were available during my care of the patient were reviewed by me and considered in my medical decision making (see chart for details).  _________________________________________   FINAL CLINICAL IMPRESSION(S) / ED DIAGNOSES  Final diagnoses:  Closed nondisplaced fracture of distal phalanx of left little finger, initial encounter    ED Discharge Orders        Ordered    naproxen (NAPROSYN) 500 MG tablet   2 times daily with meals     06/23/17 2128    traMADol (ULTRAM) 50 MG tablet  Every 6 hours PRN     06/23/17 2128       If controlled substance prescribed during this visit, 12 month history viewed on the [place] prior to issuing an initial prescription for Schedule II or III opiod.    Victorino Dike, FNP 06/24/17 0004    Eula Listen, MD 06/29/17 [phone]

## 2017-07-12 ENCOUNTER — Emergency Department
Admission: EM | Admit: 2017-07-12 | Discharge: 2017-07-12 | Disposition: A | Discharge status: Home / Self Care | Payer: Self-pay | Attending: Emergency Medicine | Admitting: Emergency Medicine

## 2017-07-12 ENCOUNTER — Encounter: Payer: Self-pay | Admitting: Emergency Medicine

## 2017-07-12 ENCOUNTER — Other Ambulatory Visit: Payer: Self-pay

## 2017-07-12 DIAGNOSIS — Z87891 Personal history of nicotine dependence: Secondary | ICD-10-CM | POA: Insufficient documentation

## 2017-07-12 DIAGNOSIS — M79645 Pain in left finger(s): Secondary | ICD-10-CM

## 2017-07-12 MED ORDER — TRAMADOL HCL 50 MG PO TABS: 50.0000 mg | ORAL_TABLET | Freq: Four times a day (QID) | ORAL | 0 refills | Status: AC | PRN

## 2017-07-12 NOTE — ED Provider Notes (Signed)
[facility] Emergency Department Provider Note  ____________________________________________   First MD Initiated Contact with Patient 07/12/17 1814     (approximate)  I have reviewed the triage vital signs and the nursing notes.   HISTORY  Chief Complaint Finger Injury    HPI Mary Maddox is a 32 y.o. female presents emergency department complaining of left fifth finger pain.  She had a fracture a few weeks ago and then hit the finger again causing a worse fracture.  The orthopedic doctor put her in a finger splint.  Now her finger will not straighten completely and she is having stinging type pain and swelling distally.  She is concerned because she cannot use her hand at work.  She states she does not usually have problems with pain but right now she wants to cry due to the stinging type pain in the hand.  She states that the tendon feels like it is really tight.  Past Medical History:  Diagnosis Date  . Anemia   . Anxiety   . Cancer [facility]) 2013   CERVICAL   . Crohn's disease in remission [facility])    as child  . Depression   . Elevated blood pressure reading    PT STATES SHE HAS BEEN HAVING HEADACHES AND HER BP MACHINE AT HOME IS READING HIGH- AT DR BYRNETTS OFFICE ON 03-11-17 BP WAS 148/88-WHEN PT COMES TO PAT ON 03-22-17 WILL RECHECK BP THEN  . Family history of adverse reaction to anesthesia    UNSURE OF MOM'S AND SISTER-HARD TO GET HER UNDER AND HARD TO WAKE UP  . Headache    MIGRAINES  . Hypokalemia   . Pneumonia 2014    Patient Active Problem List   Diagnosis Date Noted  . Pain of left breast 01/25/2017  . Discharge from left nipple 12/23/2016  . Mass of upper outer quadrant of left breast 12/23/2016    Past Surgical History:  Procedure Laterality Date  . BREAST BIOPSY Left 03/22/2017   Procedure: BREAST BIOPSY;  Surgeon: Robert Bellow, MD;  Location: [facility] ORS;  Service: General;  Laterality: Left;  . LEEP  2013   X3  .  TUBAL LIGATION Bilateral 09/01/2015   Procedure: POST PARTUM TUBAL LIGATION;  Surgeon: Will Bonnet, MD;  Location: [facility] ORS;  Service: Gynecology;  Laterality: Bilateral;  . UMBILICAL HERNIA REPAIR      Prior to Admission medications   Medication Sig Start Date End Date Taking? Authorizing Provider  ibuprofen (ADVIL,MOTRIN) 200 MG tablet Take 400 mg by mouth every 4 (four) hours as needed for headache or moderate pain.     [provider]  naproxen (NAPROSYN) 500 MG tablet Take 1 tablet (500 mg total) by mouth 2 (two) times daily with a meal. 06/23/17   Triplett, Cari B, FNP  traMADol (ULTRAM) 50 MG tablet Take 1 tablet (50 mg total) by mouth every 6 (six) hours as needed. 07/12/17   Caryn Section Linden Dolin, PA-C    Allergies Darvon [propoxyphene]  Family History  Problem Relation Age of Onset  . Hypertension Unknown   . Diabetes Unknown   . Breast cancer Paternal Grandmother   . Colon cancer Neg Hx     Social History Social History   Tobacco Use  . Smoking status: Former Smoker    Packs/day: 0.25    Years: 10.00    Pack years: 2.50    Types: Cigarettes    Last attempt to quit: 03/01/2017    Years  since quitting: 0.3  . Smokeless tobacco: Never Used  Substance Use Topics  . Alcohol use: Yes    Comment: OCC WINE  . Drug use: No    Comment: adderall    Review of Systems  Constitutional: No fever/chills Eyes: No visual changes. ENT: No sore throat. Respiratory: Denies cough Genitourinary: Negative for dysuria. Musculoskeletal: Negative for back pain.  Positive for left fifth finger pain Skin: Negative for rash.    ____________________________________________   PHYSICAL EXAM:  VITAL SIGNS: ED Triage Vitals  Enc Vitals Group     BP 07/12/17 1749 (!) 145/92     Pulse Rate 07/12/17 1749 (!) 103     Resp 07/12/17 1749 16     Temp 07/12/17 1749 98.2 F (36.8 C)     Temp Source 07/12/17 1749 Oral     SpO2 07/12/17 1749 99 %     Weight --      Height --        Head Circumference --      Peak Flow --      Pain Score 07/12/17 1750 7     Pain Loc --      Pain Edu? --      Excl. in [place]? --     Constitutional: Alert and oriented. Well appearing and in no acute distress. Eyes: Conjunctivae are normal.  Head: Atraumatic. Nose: No congestion/rhinnorhea. Mouth/Throat: Mucous membranes are moist.   Cardiovascular: Normal rate, regular rhythm. Respiratory: Normal respiratory effort.  No retractions GU: deferred Musculoskeletal: Decreased range of motion of the left fifth finger.  There is tenderness along the tendon of the finger into the palmar aspect.  She has difficulty with extending the finger.  Neurovascular does appear to be intact and no signs of infection are noted. Neurologic:  Normal speech and language.  Skin:  Skin is warm, dry and intact. No rash noted. Psychiatric: Mood and affect are normal. Speech and behavior are normal.  ____________________________________________   LABS (all labs ordered are listed, but only abnormal results are displayed)  Labs Reviewed - No data to display ____________________________________________   ____________________________________________  RADIOLOGY    ____________________________________________   PROCEDURES  Procedure(s) performed: Ulnar gutter OCL was applied by the tech  .Splint Application Date/Time: 05/23/4008 7:57 PM Performed by: Loma Boston, NT Authorized by: Versie Starks, PA-C   Consent:    Consent obtained:  Verbal   Consent given by:  Patient   Risks discussed:  Discoloration, numbness, pain and swelling   Alternatives discussed:  No treatment Pre-procedure details:    Sensation:  Normal Procedure details:    Laterality:  Left   Location:  Hand   Hand:  L hand   Strapping: no     Splint type:  Ulnar gutter   Supplies:  Ortho-[facility] Post-procedure details:    Pain:  Improved   Sensation:  Normal   Patient tolerance of procedure:  Tolerated well, no  immediate complications Comments:     I personally rechecked the splint.  The patient is neurovascularly intact post splint application.      ____________________________________________   INITIAL IMPRESSION / ASSESSMENT AND PLAN / ED COURSE  Pertinent labs & imaging results that were available during my care of the patient were reviewed by me and considered in my medical decision making (see chart for details).  Patient is a 32 year old female presents to the emergency department complaining of left fifth finger pain.  She states she had a fracture that was mild  and that she injured it again creating a worse fracture in the same bone.  She was seen at [facility] and was put in a finger splint.  She states now she cannot extend the finger.  She is having a stinging type pain that extends into the finger.  She denies any new injury since the second injury  On physical exam the left finger is tender distally and proximally along the tendons.  Patient is unable to extend the left fifth finger without difficulty.  She is neurovascularly intact.  Explained the findings to the patient.  Explained to her how important is to follow-up with orthopedics that she may need physical therapy due to the stiffening of the tendons.  She was placed in a ulnar gutter OCL for comfort and for protection.  She is to keep the hand elevated.  She does not use the left hand at work.  She is to follow-up with orthopedics.  She states she understands will comply with our instructions.  She was given a prescription for tramadol 50 mg #15 with no refill.  She was discharged in stable condition     As part of my medical decision making, I reviewed the following data within the [place] notes reviewed and incorporated, Old chart reviewed, Notes from prior ED visits and [place] Controlled Substance Database  ____________________________________________   FINAL CLINICAL IMPRESSION(S) / ED  DIAGNOSES  Final diagnoses:  Finger pain, left      NEW MEDICATIONS STARTED DURING THIS VISIT:  New Prescriptions   TRAMADOL (ULTRAM) 50 MG TABLET    Take 1 tablet (50 mg total) by mouth every 6 (six) hours as needed.     Note:  This document was prepared using Dragon voice recognition software and may include unintentional dictation errors.    Versie Starks, PA-C 07/12/17 Danton Clap, MD 07/12/17 [phone]

## 2017-07-12 NOTE — ED Triage Notes (Signed)
PT to ED via POV with c/o LFT 5th digit pain, fracture xfew weeks ago and followed up with ortho. PT states reinjured last week and followed up again with a change in xray. PT states increased pain and burning in her hand. PT has splint applied to finger. VSS

## 2017-07-12 NOTE — Discharge Instructions (Addendum)
Follow-up with [facility] as previously discussed.  Keep the hand elevated.  Do not use the left hand at work.

## 2017-07-29 ENCOUNTER — Encounter: Payer: Self-pay | Admitting: Emergency Medicine

## 2017-07-29 ENCOUNTER — Other Ambulatory Visit: Payer: Self-pay

## 2017-07-29 ENCOUNTER — Emergency Department
Admission: EM | Admit: 2017-07-29 | Discharge: 2017-07-29 | Disposition: A | Discharge status: Home / Self Care | Payer: Self-pay | Attending: Emergency Medicine | Admitting: Emergency Medicine

## 2017-07-29 DIAGNOSIS — Z87891 Personal history of nicotine dependence: Secondary | ICD-10-CM | POA: Insufficient documentation

## 2017-07-29 DIAGNOSIS — X58XXXD Exposure to other specified factors, subsequent encounter: Secondary | ICD-10-CM | POA: Insufficient documentation

## 2017-07-29 DIAGNOSIS — S62639A Displaced fracture of distal phalanx of unspecified finger, initial encounter for closed fracture: Secondary | ICD-10-CM

## 2017-07-29 DIAGNOSIS — S62637G Displaced fracture of distal phalanx of left little finger, subsequent encounter for fracture with delayed healing: Secondary | ICD-10-CM | POA: Insufficient documentation

## 2017-07-29 NOTE — ED Provider Notes (Signed)
[facility] Emergency Department Provider Note ____________________________________________  Time seen: 1319  I have reviewed the triage vital signs and the nursing notes.  HISTORY  Chief Complaint  Finger Injury  HPI Mary Maddox is a 32 y.o. with bilateral (L>R) hand dominance female returns for the third time to this ED for evaluation of continued left pinky pain and disability. She was initially seen here on 06/23/17, 4 days after she apparently got her finger caught in a bounce-house netting. She was confirmed to have a non-displaced distal tuft fracture.  She was placed in an Alumafoam finger splint, and had her fingers buddy-taped. She subsequently saw [facility] Sabra Heck) the next day, 06/24/17 for follow-up. From there, she was placed in a Stax finger splint and scheduled to return in 4 weeks. She apparently had an early return visit to [facility] on 07/07/17 after she accidentally hit the finger again. She was found to have some widening of the fracture site. She was again placed in an Alumafoam splint and set for RTO in 2 weeks with Dr. Sabra Heck. She returned to this ED again on 07/12/17 claiming inability to straighten the fingers and pain into the palm. She was apparently placed in an ulnar gutter splint and discharged to F/U with ortho as scheduled. She is present without any finger splint in place. She claims that her hand, including the injured finger are fixed in a flexed position. She also claims that she had to remove the previously placed ulnar gutter splint because it got dirty. She denies any re-injury in the interim.  Past Medical History:  Diagnosis Date  . Anemia   . Anxiety   . Cancer [facility]) 2013   CERVICAL   . Crohn's disease in remission [facility])    as child  . Depression   . Elevated blood pressure reading    PT STATES SHE HAS BEEN HAVING HEADACHES AND HER BP MACHINE AT HOME IS READING HIGH- AT DR BYRNETTS OFFICE ON 03-11-17 BP WAS 148/88-WHEN PT  COMES TO PAT ON 03-22-17 WILL RECHECK BP THEN  . Family history of adverse reaction to anesthesia    UNSURE OF MOM'S AND SISTER-HARD TO GET HER UNDER AND HARD TO WAKE UP  . Headache    MIGRAINES  . Hypokalemia   . Pneumonia 2014    Patient Active Problem List   Diagnosis Date Noted  . Pain of left breast 01/25/2017  . Discharge from left nipple 12/23/2016  . Mass of upper outer quadrant of left breast 12/23/2016    Past Surgical History:  Procedure Laterality Date  . BREAST BIOPSY Left 03/22/2017   Procedure: BREAST BIOPSY;  Surgeon: Robert Bellow, MD;  Location: [facility] ORS;  Service: General;  Laterality: Left;  . LEEP  2013   X3  . TUBAL LIGATION Bilateral 09/01/2015   Procedure: POST PARTUM TUBAL LIGATION;  Surgeon: Will Bonnet, MD;  Location: [facility] ORS;  Service: Gynecology;  Laterality: Bilateral;  . UMBILICAL HERNIA REPAIR      Prior to Admission medications   Medication Sig Start Date End Date Taking? Authorizing Provider  ibuprofen (ADVIL,MOTRIN) 200 MG tablet Take 400 mg by mouth every 4 (four) hours as needed for headache or moderate pain.     [provider]  naproxen (NAPROSYN) 500 MG tablet Take 1 tablet (500 mg total) by mouth 2 (two) times daily with a meal. 06/23/17   Triplett, Cari B, FNP  traMADol (ULTRAM) 50 MG tablet Take 1 tablet (50 mg  total) by mouth every 6 (six) hours as needed. 07/12/17   Caryn Section Linden Dolin, PA-C    Allergies Darvon [propoxyphene]  Family History  Problem Relation Age of Onset  . Hypertension Unknown   . Diabetes Unknown   . Breast cancer Paternal Grandmother   . Colon cancer Neg Hx     Social History Social History   Tobacco Use  . Smoking status: Former Smoker    Packs/day: 0.25    Years: 10.00    Pack years: 2.50    Types: Cigarettes    Last attempt to quit: 03/01/2017    Years since quitting: 0.4  . Smokeless tobacco: Never Used  Substance Use Topics  . Alcohol use: Yes    Comment: OCC WINE  . Drug use:  No    Comment: adderall    Review of Systems  Constitutional: Negative for fever. Cardiovascular: Negative for chest pain. Respiratory: Negative for shortness of breath. Musculoskeletal: Negative for back pain. Left pinky pain Skin: Negative for rash. Neurological: Negative for headaches, focal weakness or numbness. ____________________________________________  PHYSICAL EXAM:  VITAL SIGNS: ED Triage Vitals  Enc Vitals Group     BP 07/29/17 1251 134/87     Pulse Rate 07/29/17 1251 81     Resp 07/29/17 1251 18     Temp 07/29/17 1251 98.6 F (37 C)     Temp Source 07/29/17 1251 Oral     SpO2 07/29/17 1251 99 %     Weight 07/29/17 1250 150 lb (68 kg)     Height --      Head Circumference --      Peak Flow --      Pain Score 07/29/17 1250 4     Pain Loc --      Pain Edu? --      Excl. in [place]? --     Constitutional: Alert and oriented. Well appearing and in no distress. Head: Normocephalic and atraumatic. Cardiovascular: Normal rate, regular rhythm. Normal distal pulses and capillary refill. Respiratory: Normal respiratory effort.  Musculoskeletal: left hand with 4th & 5th digits held in flexion actively. No palmar tendon contractures noted. Normal, smooth finger extension noted on exam. No SUH or finger pad edema/ecchymosis noted. Normal composite fist. Nontender with normal range of motion in all extremities.  Neurologic:  Normal gross sensation.  Normal intrinsic & opposition testing. No gross focal neurologic deficits are appreciated. Skin:  Skin is warm, dry and intact. No rash noted. ____________________________________________  PROCEDURES  Procedures Finger Alumafoam splint - folded over distal phalanx and DIP ____________________________________________  INITIAL IMPRESSION / ASSESSMENT AND PLAN / ED COURSE  Patient with her third subsequent visit to the ED for continued complaints of pain and disability to the left pinky finger; despite currently being under the  care & management of ortho. Patient has a confirmed distal tuft fracture and overall benign exam.  She does not have any extensor tendon deficit, any signs of tendinopathy, or signs of infection.  Neurovascularly patient is intact to the left hand and fingers.  She is again placed in a fold over aluminum splint to the finger and advised to keep her appointment with orthopedics for next week as scheduled.  Her request for replacement of an ulnar gutter splint was declined. ____________________________________________  FINAL CLINICAL IMPRESSION(S) / ED DIAGNOSES  Final diagnoses:  Closed fracture of tuft of distal phalanx of finger      Carmie End, Dannielle Karvonen, PA-C 07/29/17 1904    Eula Listen, MD  07/29/17 2000  

## 2017-07-29 NOTE — Discharge Instructions (Addendum)
Wear the finger splint until you are re-evaluated by ortho next week, as scheduled. Place the finger in ice water to reduce pain and swelling. Take the anti-inflammatories previously prescribed.

## 2017-08-22 ENCOUNTER — Emergency Department
Admission: EM | Admit: 2017-08-22 | Discharge: 2017-08-23 | Disposition: A | Discharge status: Home / Self Care | Payer: Self-pay | Attending: Emergency Medicine | Admitting: Emergency Medicine

## 2017-08-22 ENCOUNTER — Encounter: Payer: Self-pay | Admitting: Emergency Medicine

## 2017-08-22 ENCOUNTER — Other Ambulatory Visit: Payer: Self-pay

## 2017-08-22 DIAGNOSIS — R569 Unspecified convulsions: Secondary | ICD-10-CM | POA: Insufficient documentation

## 2017-08-22 DIAGNOSIS — Z87891 Personal history of nicotine dependence: Secondary | ICD-10-CM | POA: Insufficient documentation

## 2017-08-22 DIAGNOSIS — R519 Headache, unspecified: Secondary | ICD-10-CM

## 2017-08-22 DIAGNOSIS — R5383 Other fatigue: Secondary | ICD-10-CM | POA: Insufficient documentation

## 2017-08-22 DIAGNOSIS — Z79899 Other long term (current) drug therapy: Secondary | ICD-10-CM | POA: Insufficient documentation

## 2017-08-22 DIAGNOSIS — Z8541 Personal history of malignant neoplasm of cervix uteri: Secondary | ICD-10-CM | POA: Insufficient documentation

## 2017-08-22 DIAGNOSIS — R51 Headache: Secondary | ICD-10-CM

## 2017-08-22 MED ORDER — KETOROLAC TROMETHAMINE 30 MG/ML IJ SOLN
30.0000 mg | Freq: Once | INTRAMUSCULAR | Status: AC
  Administered 2017-08-23: 30 mg via INTRAVENOUS
  Filled 2017-08-22: qty 1

## 2017-08-22 MED ORDER — SODIUM CHLORIDE 0.9 % IV BOLUS
1000.0000 mL | Freq: Once | INTRAVENOUS | Status: AC
  Administered 2017-08-23: 1000 mL via INTRAVENOUS

## 2017-08-22 MED ORDER — DIPHENHYDRAMINE HCL 50 MG/ML IJ SOLN
25.0000 mg | Freq: Once | INTRAMUSCULAR | Status: AC
  Administered 2017-08-23: 25 mg via INTRAVENOUS
  Filled 2017-08-22: qty 1

## 2017-08-22 MED ORDER — METOCLOPRAMIDE HCL 5 MG/ML IJ SOLN
10.0000 mg | Freq: Once | INTRAMUSCULAR | Status: AC
  Administered 2017-08-23: 10 mg via INTRAVENOUS
  Filled 2017-08-22: qty 2

## 2017-08-22 NOTE — ED Notes (Signed)
Per MD Paduchowski, no protocols or scans needed at this time.

## 2017-08-22 NOTE — ED Triage Notes (Signed)
Pt arrives ambulatory to triage with c/o of a seizure this morning and feeling badly the rest of the day. Pt reports that she stopped taking her clonopin x 2 weeks ago " because I didn't like the way it made me feel" and has had two seizures since that time. Pt is in NAD.

## 2017-08-23 ENCOUNTER — Emergency Department: Payer: Self-pay

## 2017-08-23 LAB — URINALYSIS, COMPLETE (UACMP) WITH MICROSCOPIC
BILIRUBIN URINE: NEGATIVE
Glucose, UA: NEGATIVE mg/dL
KETONES UR: 20 mg/dL — AB
LEUKOCYTES UA: NEGATIVE
Nitrite: NEGATIVE
Protein, ur: 30 mg/dL — AB
Specific Gravity, Urine: 1.015 (ref 1.005–1.030)
pH: 5 (ref 5.0–8.0)

## 2017-08-23 LAB — COMPREHENSIVE METABOLIC PANEL
ALT: 12 U/L — AB (ref 14–54)
AST: 26 U/L (ref 15–41)
Albumin: 4.3 g/dL (ref 3.5–5.0)
Alkaline Phosphatase: 81 U/L (ref 38–126)
Anion gap: 9 (ref 5–15)
BUN: 14 mg/dL (ref 6–20)
CHLORIDE: 104 mmol/L (ref 101–111)
CO2: 22 mmol/L (ref 22–32)
CREATININE: 0.55 mg/dL (ref 0.44–1.00)
Calcium: 9.1 mg/dL (ref 8.9–10.3)
GFR calc Af Amer: >60 mL/min (ref >60)
Glucose, Bld: 85 mg/dL (ref 65–99)
POTASSIUM: 3.6 mmol/L (ref 3.5–5.1)
Sodium: 135 mmol/L (ref 135–145)
Total Bilirubin: 0.6 mg/dL (ref 0.3–1.2)
Total Protein: 7.2 g/dL (ref 6.5–8.1)

## 2017-08-23 LAB — CBC
HCT: 37.4 % (ref 35.0–47.0)
HEMOGLOBIN: 12.6 g/dL (ref 12.0–16.0)
MCH: 26.6 pg (ref 26.0–34.0)
MCHC: 33.6 g/dL (ref 32.0–36.0)
MCV: 79.1 fL — AB (ref 80.0–100.0)
Platelets: 208 10*3/uL (ref 150–440)
RBC: 4.73 MIL/uL (ref 3.80–5.20)
RDW: 14.5 % (ref 11.5–14.5)
WBC: 3.8 10*3/uL (ref 3.6–11.0)

## 2017-08-23 LAB — URINE DRUG SCREEN, QUALITATIVE (ARMC ONLY)
AMPHETAMINES, UR SCREEN: NOT DETECTED
BENZODIAZEPINE, UR SCRN: NOT DETECTED
Cannabinoid 50 Ng, Ur ~~LOC~~: POSITIVE — AB
Cocaine Metabolite,Ur ~~LOC~~: NOT DETECTED
MDMA (Ecstasy)Ur Screen: NOT DETECTED
METHADONE SCREEN, URINE: NOT DETECTED
OPIATE, UR SCREEN: NOT DETECTED
Phencyclidine (PCP) Ur S: NOT DETECTED
TRICYCLIC, UR SCREEN: NOT DETECTED

## 2017-08-23 LAB — LIPASE, BLOOD: LIPASE: 31 U/L (ref 11–51)

## 2017-08-23 LAB — LACTIC ACID, PLASMA: LACTIC ACID, VENOUS: 0.6 mmol/L (ref 0.5–1.9)

## 2017-08-23 MED ORDER — LEVETIRACETAM IN NACL 1000 MG/100ML IV SOLN
1000.0000 mg | Freq: Once | INTRAVENOUS | Status: AC
  Administered 2017-08-23: 1000 mg via INTRAVENOUS
  Filled 2017-08-23: qty 100

## 2017-08-23 NOTE — ED Provider Notes (Signed)
[facility] Emergency Department Provider Note   ____________________________________________   First MD Initiated Contact with Patient 08/22/17 2355     (approximate)  I have reviewed the triage vital signs and the nursing notes.   HISTORY  Chief Complaint Headache    HPI Mary Maddox is a 32 y.o. female who comes into the hospital today with a seizure.  The patient states that she had a seizure this morning and her body still has not recovered from it.  She states that she had one about 2 to 3 weeks ago and that was the first seizure she is ever had.  She was not seen in the hospital for her seizure.  She reports that before her arm seem to go numb and she could not talk.  Today she states that at 5 AM she woke up and she could not move.  She states that she felt like a [place] was on her body and she had pain to the right side of her head.  She states that she slept all day long and was unable to keep down any fluids as she was vomiting and shaking all day.  The patient states that she was started on a trial of medication about 1 month ago that was some form of Klonopin that she says starts with a T.  The patient only took it for a week but then stopped it 3 weeks ago as she did not like how it made her feel.  The patient denies any chest pain but has some mild shortness of breath.  She feels some tightness all over her body but denies any abdominal pain.  She rates her headache an 8 out of 10 in intensity.  She states that she has not felt like herself for the past couple of days and felt like something was wrong.  She is here today for evaluation of her symptoms.   Past Medical History:  Diagnosis Date  . Anemia   . Anxiety   . 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 Crohn's disease in remission [facility])    as child  . Depression   . Elevated blood pressure reading    PT STATES SHE HAS BEEN HAVING HEADACHES AND HER BP MACHINE AT HOME IS READING HIGH- AT  DR BYRNETTS OFFICE ON 03-11-17 BP WAS 148/88-WHEN PT COMES TO PAT ON 03-22-17 WILL RECHECK BP THEN  . Family history of adverse reaction to anesthesia    UNSURE OF MOM'S AND SISTER-HARD TO GET HER UNDER AND HARD TO WAKE UP  . Headache    MIGRAINES  . Hypokalemia   . Pneumonia 2014    Patient Active Problem List   Diagnosis Date Noted  . Pain of left breast 01/25/2017  . Discharge from left nipple 12/23/2016  . Mass of upper outer quadrant of left breast 12/23/2016    Past Surgical History:  Procedure Laterality Date  . BREAST BIOPSY Left 03/22/2017   Procedure: BREAST BIOPSY;  Surgeon: Robert Bellow, MD;  Location: [facility] ORS;  Service: General;  Laterality: Left;  . LEEP  2013   X3  . TUBAL LIGATION Bilateral 09/01/2015   Procedure: POST PARTUM TUBAL LIGATION;  Surgeon: Will Bonnet, MD;  Location: [facility] ORS;  Service: Gynecology;  Laterality: Bilateral;  . UMBILICAL HERNIA REPAIR      Prior to Admission medications   Medication Sig Start Date End Date Taking? Authorizing Provider  ibuprofen (ADVIL,MOTRIN) 200 MG tablet Take  400 mg by mouth every 4 (four) hours as needed for headache or moderate pain.     [provider]  naproxen (NAPROSYN) 500 MG tablet Take 1 tablet (500 mg total) by mouth 2 (two) times daily with a meal. 06/23/17   Triplett, Cari B, FNP  traMADol (ULTRAM) 50 MG tablet Take 1 tablet (50 mg total) by mouth every 6 (six) hours as needed. 07/12/17   Caryn Section Linden Dolin, PA-C    Allergies Darvon [propoxyphene]  Family History  Problem Relation Age of Onset  . Hypertension Unknown   . Diabetes Unknown   . Breast cancer Paternal Grandmother   . Colon cancer Neg Hx     Social History Social History   Tobacco Use  . Smoking status: Former Smoker    Packs/day: 0.25    Years: 10.00    Pack years: 2.50    Types: Cigarettes    Last attempt to quit: 03/01/2017    Years since quitting: 0.4  . Smokeless tobacco: Never Used  Substance Use Topics  .  Alcohol use: Yes    Comment: OCC WINE  . Drug use: Yes    Types: Marijuana    Comment: adderall    Review of Systems  Constitutional: No fever/chills Eyes: No visual changes. ENT: No sore throat. Cardiovascular: Denies chest pain. Respiratory: Denies shortness of breath. Gastrointestinal: Nausea and vomiting with no abdominal pain.  No diarrhea.  No constipation. Genitourinary: Negative for dysuria. Musculoskeletal: Negative for back pain. Skin: Negative for rash. Neurological: Seizure, headache   ____________________________________________   PHYSICAL EXAM:  VITAL SIGNS: ED Triage Vitals  Enc Vitals Group     BP 08/22/17 2151 (!) 140/92     Pulse Rate 08/22/17 2151 73     Resp 08/22/17 2151 16     Temp 08/22/17 2151 98.9 F (37.2 C)     Temp Source 08/22/17 2151 Oral     SpO2 08/22/17 2151 100 %     Weight 08/22/17 2152 140 lb (63.5 kg)     Height 08/22/17 2152 5\' 6"  (1.676 m)     Head Circumference --      Peak Flow --      Pain Score 08/22/17 2151 8     Pain Loc --      Pain Edu? --      Excl. in [place]? --     Constitutional: Alert and oriented. Well appearing and in moderate distress. Eyes: Conjunctivae are normal. PERRL. EOMI. Head: Atraumatic. Nose: No congestion/rhinnorhea. Mouth/Throat: Mucous membranes are moist.  Oropharynx non-erythematous. Cardiovascular: Normal rate, regular rhythm. Grossly normal heart sounds.  Good peripheral circulation. Respiratory: Normal respiratory effort.  No retractions. Lungs CTAB. Gastrointestinal: Soft and nontender. No distention.  Positive bowel sounds Musculoskeletal: No lower extremity tenderness nor edema.  No joint effusions. Neurologic:  Normal speech and language.  Cranial nerves II through XII are grossly intact with no focal motor neuro deficit, no pronator drift and finger-to-nose intact.   Skin:  Skin is warm, dry and intact.  Psychiatric: Mood and affect are normal.    ____________________________________________   LABS (all labs ordered are listed, but only abnormal results are displayed)  Labs Reviewed  CBC - Abnormal; Notable for the following components:      Result Value   MCV 79.1 (*)    All other components within normal limits  COMPREHENSIVE METABOLIC PANEL - Abnormal; Notable for the following components:   ALT 12 (*)    All other components within  normal limits  URINALYSIS, COMPLETE (UACMP) WITH MICROSCOPIC - Abnormal; Notable for the following components:   Color, Urine YELLOW (*)    APPearance HAZY (*)    Hgb urine dipstick LARGE (*)    Ketones, ur 20 (*)    Protein, ur 30 (*)    RBC / HPF >50 (*)    Bacteria, UA RARE (*)    All other components within normal limits  URINE DRUG SCREEN, QUALITATIVE ([facility] ONLY) - Abnormal; Notable for the following components:   Cannabinoid 50 Ng, Ur [place] POSITIVE (*)    Barbiturates, Ur Screen   (*)    Value: Result not available. Reagent lot number recalled by manufacturer.   All other components within normal limits  LACTIC ACID, PLASMA  LIPASE, BLOOD  POC URINE PREG, ED   ____________________________________________  EKG  none ____________________________________________  RADIOLOGY  ED MD interpretation: CT head: Normal head CT.  Official radiology report(s): Ct Head Wo Contrast  Result Date: 08/23/2017 CLINICAL DATA:  Seizure this morning EXAM: CT HEAD WITHOUT CONTRAST TECHNIQUE: Contiguous axial images were obtained from the base of the skull through the vertex without intravenous contrast. COMPARISON:  01/20/2015 FINDINGS: BRAIN: The ventricles and sulci are normal. No intraparenchymal hemorrhage, mass effect nor midline shift. No acute large vascular territory infarcts. Grey-white matter distinction is maintained. The basal ganglia are unremarkable. No abnormal extra-axial fluid collections. Basal cisterns are not effaced and midline. The brainstem and cerebellar hemispheres are  without acute abnormalities. VASCULAR: No hyperdense vessel sign or unexpected calcifications. SKULL/SOFT TISSUES: No skull fracture. No significant soft tissue swelling. ORBITS/SINUSES: The included ocular globes and orbital contents are normal.The mastoid air cells are clear. The included paranasal sinuses are well-aerated. OTHER: None. IMPRESSION: Normal head CT. Electronically Signed   By: Ashley Royalty M.D.   On: 08/23/2017 00:51    ____________________________________________   PROCEDURES  Procedure(s) performed: None  Procedures  Critical Care performed: No  ____________________________________________   INITIAL IMPRESSION / ASSESSMENT AND PLAN / ED COURSE  As part of my medical decision making, I reviewed the following data within the electronic MEDICAL RECORD NUMBER Notes from prior ED visits and [place] Controlled Substance Database   This is a 32 year old female who comes into the hospital today with a seizure.  The patient's first seizure was approximately 2 to 3 weeks ago.  Although the patient stopped taking her Klonopin 3 weeks ago she was only on it for a week.  The patient is here today for evaluation of her symptoms.  The patient's blood work returned unremarkable.  The patient's urine drug screen was positive for cannabinoids and the urinalysis did show some blood but no white blood cells.  I would send the urine allergist for culture.  The patient had a CT scan of her head which was also unremarkable.  She received some Reglan, Benadryl, Toradol for her headache.  The patient also received some Keppra for the seizures.  I will encourage the patient to follow-up with neurology and to return with any worsening symptoms.  Since we are unsure of the exact cause of the patient's seizure-like activity I will not start her on antiepileptics at present.  If the patient should return with further seizures we will then start her on antiepileptics.       ____________________________________________   FINAL CLINICAL IMPRESSION(S) / ED DIAGNOSES  Final diagnoses:  Seizure ([place])  Acute nonintractable headache, unspecified headache type  Other fatigue     ED Discharge Orders  None       Note:  This document was prepared using Dragon voice recognition software and may include unintentional dictation errors.    Loney Hering, MD 08/23/17 0430

## 2017-08-23 NOTE — ED Notes (Signed)
Report from iris, rn. 

## 2017-08-23 NOTE — Discharge Instructions (Addendum)
Please follow up with neurology for further evaluation of your headache and seizures

## 2017-08-23 NOTE — ED Notes (Signed)
Pt reports that she had a seizure at 0500 yesterday 08/22/2017.  Pt reports she had another seizure 2 weeks ago.  Pt reports she stopped taking her klonopin at this time, but that she was only on the medication for a week.  Pt is A&Ox4, in NAD.

## 2019-02-18 IMAGING — CT CT HEAD W/O CM
3 series · 15 of 47 positions shown, 18 images · non-contrast
Comparison: 01/20/2015

CLINICAL DATA: Seizure this morning

EXAM:
CT HEAD WITHOUT CONTRAST
TECHNIQUE: Contiguous axial images were obtained from the base of the skull
through the vertex without intravenous contrast.

[Series 2: head wo · axial · 0.47mm/px · z∈[-142,-17]mm · 9 of 30 slices shown, 12 images]
[im 3/30  brain]
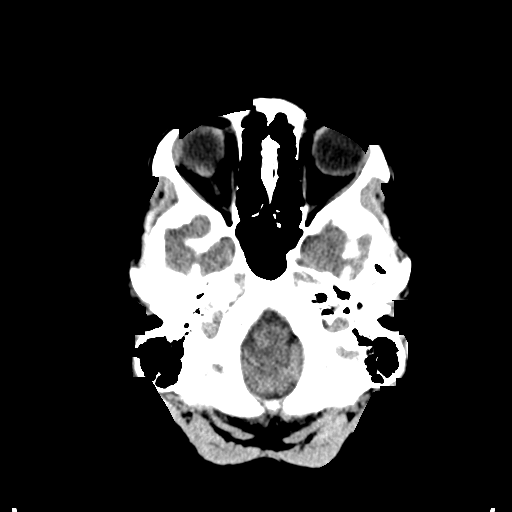
[im 3/30  bone]
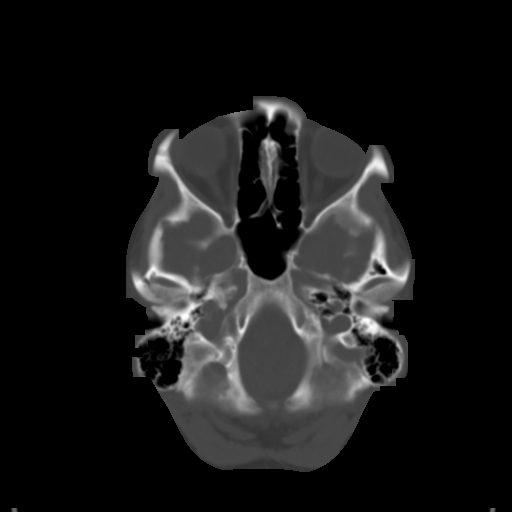
[im 6/30  brain]
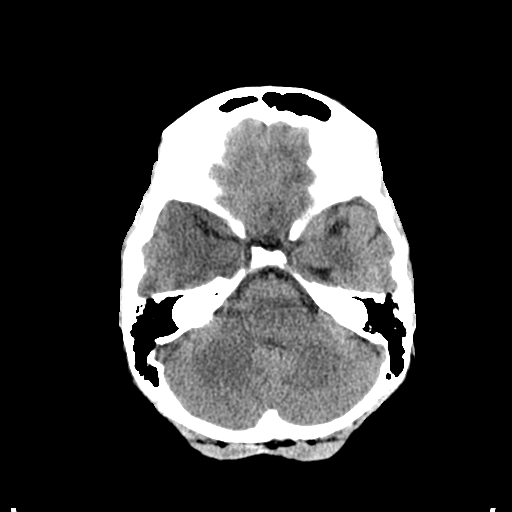
[im 9/30  brain]
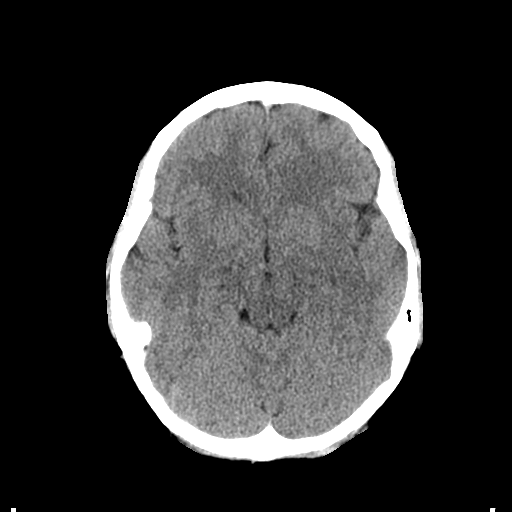
[im 12/30  brain]
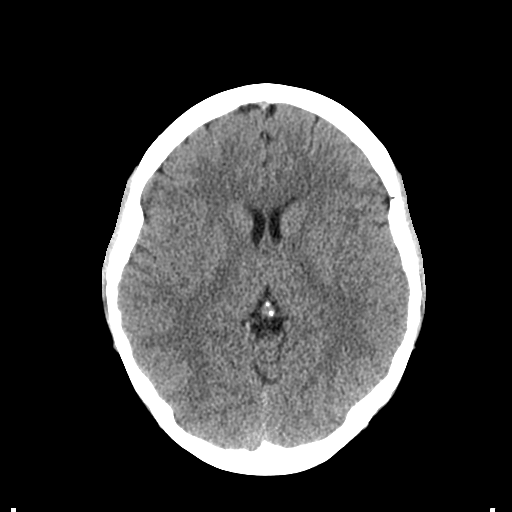
[im 16/30  brain]
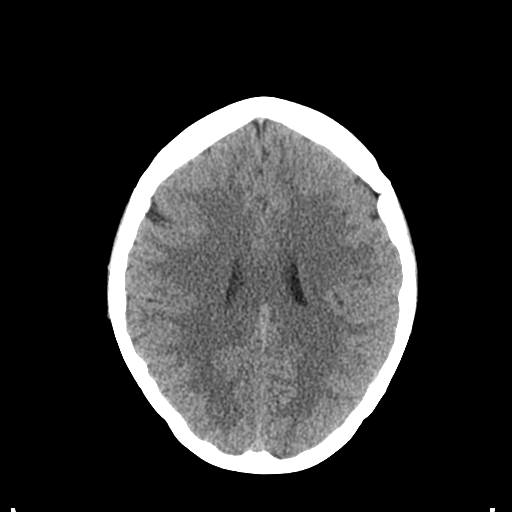
[im 16/30  bone]
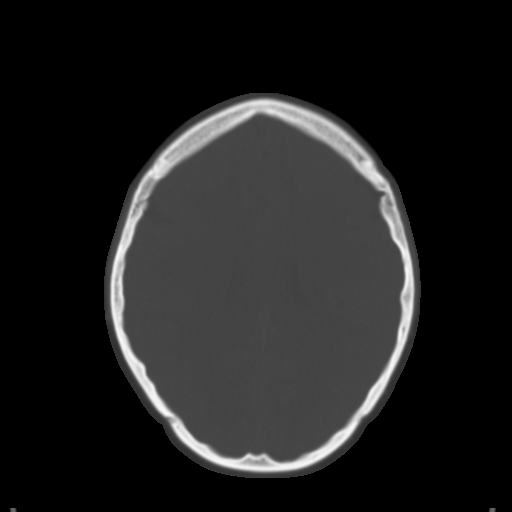
[im 19/30  brain]
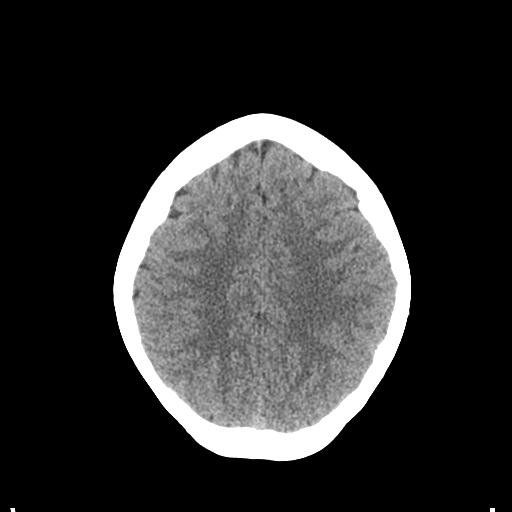
[im 22/30  brain]
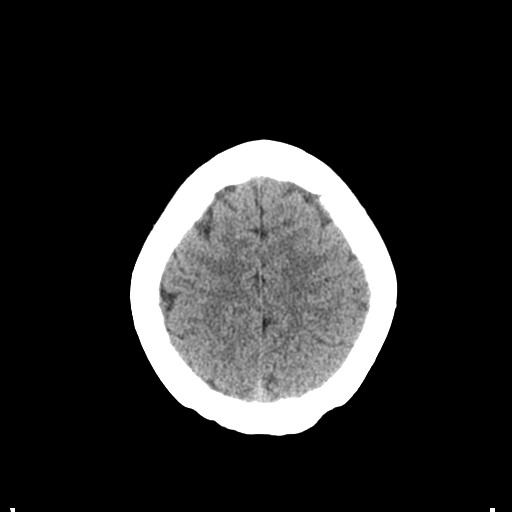
[im 25/30  brain]
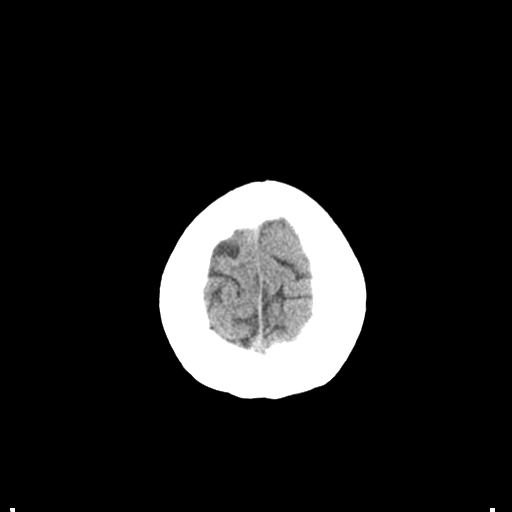
[im 28/30  brain]
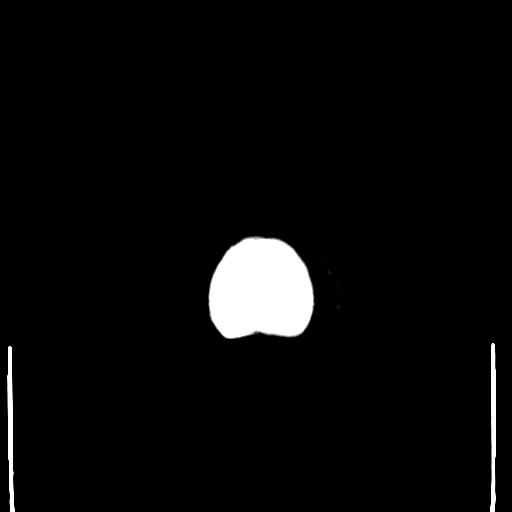
[im 28/30  bone]
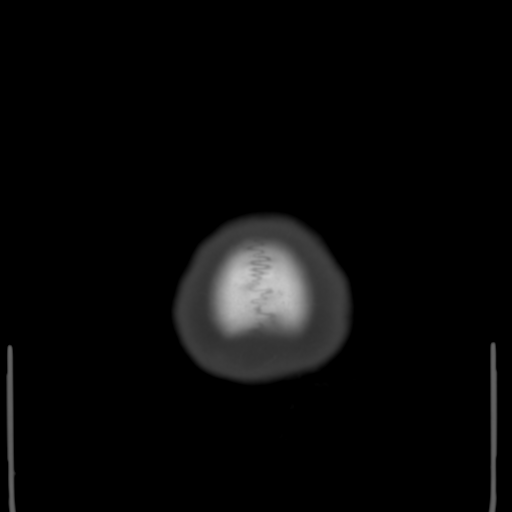

[Series 4: coronal soft tissue · coronal · 0.30mm/px · 3 of 59 slices shown]
[im 20/59  brain]
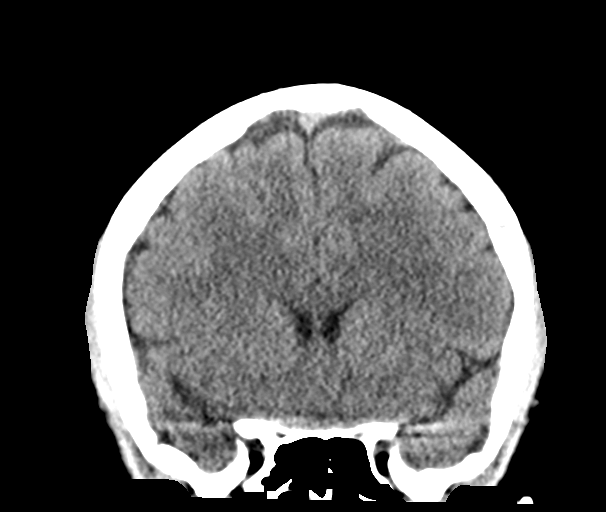
[im 26/59  brain]
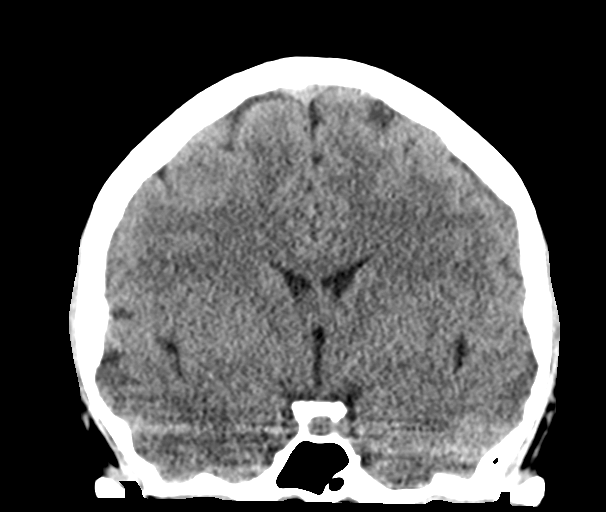
[im 33/59  brain]
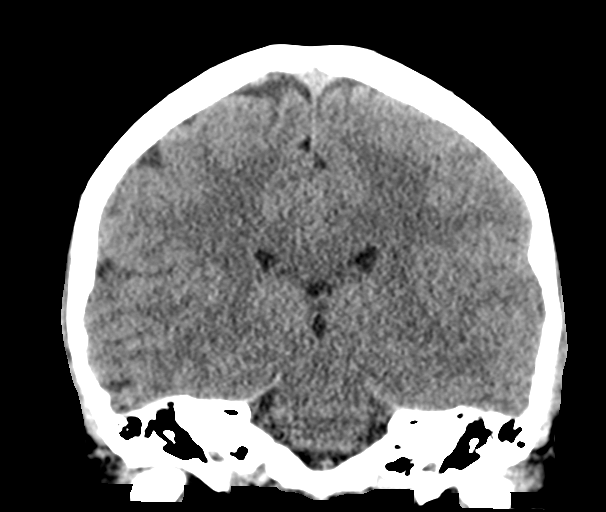

[Series 5: sagittal soft tissue · sagittal · 0.30mm/px · 3 of 53 slices shown]
[im 18/53  brain]
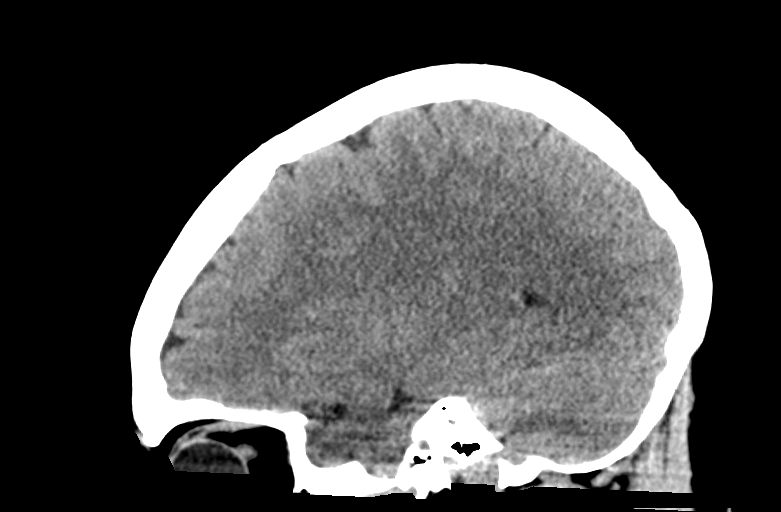
[im 27/53  brain]
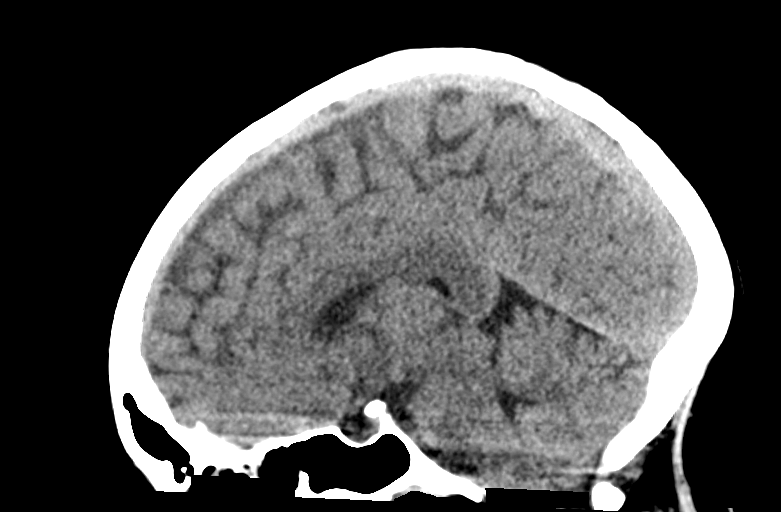
[im 35/53  brain]
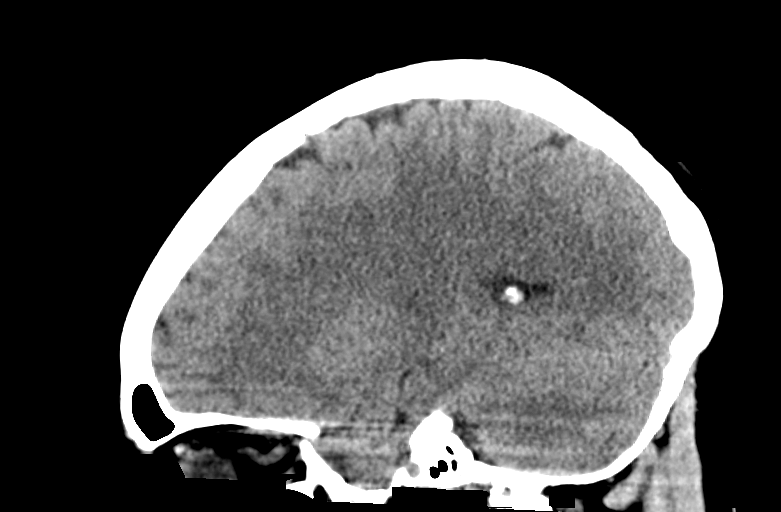

[15 of 47 positions shown; findings below may reference images not displayed]

FINDINGS: BRAIN: The ventricles and sulci are normal. No intraparenchymal
hemorrhage, mass effect nor midline shift. No acute large vascular
territory infarcts. Grey-white matter distinction is maintained. The
basal ganglia are unremarkable. No abnormal extra-axial fluid
collections. Basal cisterns are not effaced and midline. The
brainstem and cerebellar hemispheres are without acute
abnormalities.

VASCULAR: No hyperdense vessel sign or unexpected calcifications.

SKULL/SOFT TISSUES: No skull fracture. No significant soft tissue
swelling.

ORBITS/SINUSES: The included ocular globes and orbital contents are
normal.The mastoid air cells are clear. The included paranasal
sinuses are well-aerated.

OTHER: None.
IMPRESSION: Normal head CT.

## 2023-09-17 ENCOUNTER — Encounter: Payer: Self-pay | Admitting: Advanced Practice Midwife
